# Patient Record
Sex: Female | Born: 1990 | Race: White | Hispanic: No | Marital: Single | Smoking: Former smoker
Health system: Southern US, Community
[De-identification: ages and names within clinical notes are randomized; demographics above are authoritative.]

## PROBLEM LIST (undated history)

## (undated) DIAGNOSIS — D649 Anemia, unspecified: Secondary | ICD-10-CM

## (undated) DIAGNOSIS — I10 Essential (primary) hypertension: Secondary | ICD-10-CM

## (undated) HISTORY — DX: Anemia, unspecified: D64.9

## (undated) HISTORY — DX: Essential (primary) hypertension: I10

## (undated) HISTORY — PX: OTHER SURGICAL HISTORY: SHX169

---

## 2019-10-30 DIAGNOSIS — F411 Generalized anxiety disorder: Secondary | ICD-10-CM | POA: Insufficient documentation

## 2019-11-21 NOTE — L&D Delivery Note (Addendum)
OB/GYN Faculty Practice Delivery Note  Hannah Campos is a 29 y.o. G1P0 s/p VD at [redacted]w[redacted]d at  She was admitted for IOL for preeclampsia with severe features.     AROM: 27h 65m with clear fluid GBS Status: being treated for GBS, status was unknown  Maximum Maternal Temperature: 97.6  Labor Progress: S/p Foley bulb, cytotec pitocin, and AROM   Delivery Date/Time: 8.22.21 1550 Delivery: Called to room and patient was complete and pushing. Head delivered LOA. No nuchal cord present. Shoulder and body delivered in usual fashion. Infant with spontaneous cry, placed on mother's abdomen, dried and stimulated. Cord clamped x 2 after 1-minute delay, and cut by FOB Cord blood drawn. Placenta delivered spontaneously with gentle cord traction. Fundus firm with massage and Pitocin. TXA 1 g administered to prevent additional bleeding. Cytotec 800 mcg buccally also given. Labia, perineum, vagina, and cervix inspected inspected with 1st degree vaginal.   Placenta: Hannah Campos, 3V cord Complications: no intrapartum  Lacerations: 1st degree vaginal  EBL: 587 Analgesia: epidural   SW consult ordered for Hx GAD. Continue Lexapro.    Infant: boy  APGARs 7, 8     Hannah Campos, [name], [name] 07/11/2020 5:46 PM

## 2019-12-30 DIAGNOSIS — Z3490 Encounter for supervision of normal pregnancy, unspecified, unspecified trimester: Secondary | ICD-10-CM | POA: Insufficient documentation

## 2019-12-30 DIAGNOSIS — Z87730 Personal history of (corrected) cleft lip and palate: Secondary | ICD-10-CM | POA: Insufficient documentation

## 2019-12-30 LAB — OB RESULTS CONSOLE RUBELLA ANTIBODY, IGM: Rubella: IMMUNE

## 2019-12-30 LAB — OB RESULTS CONSOLE HEPATITIS B SURFACE ANTIGEN: Hepatitis B Surface Ag: NEGATIVE

## 2019-12-30 LAB — HEPATITIS C ANTIBODY: HCV Ab: NEGATIVE

## 2019-12-31 DIAGNOSIS — Z6791 Unspecified blood type, Rh negative: Secondary | ICD-10-CM | POA: Insufficient documentation

## 2020-05-14 LAB — OB RESULTS CONSOLE HIV ANTIBODY (ROUTINE TESTING): HIV: NONREACTIVE

## 2020-05-27 ENCOUNTER — Ambulatory Visit (INDEPENDENT_AMBULATORY_CARE_PROVIDER_SITE_OTHER): Payer: Medicaid Other | Admitting: Family Medicine

## 2020-05-27 ENCOUNTER — Encounter: Payer: Self-pay | Admitting: Family Medicine

## 2020-05-27 ENCOUNTER — Other Ambulatory Visit: Payer: Self-pay

## 2020-05-27 VITALS — BP 133/84 | HR 90 | Ht 62.5 in | Wt 205.0 lb

## 2020-05-27 DIAGNOSIS — O360130 Maternal care for anti-D [Rh] antibodies, third trimester, not applicable or unspecified: Secondary | ICD-10-CM

## 2020-05-27 DIAGNOSIS — O99343 Other mental disorders complicating pregnancy, third trimester: Secondary | ICD-10-CM | POA: Diagnosis not present

## 2020-05-27 DIAGNOSIS — Z23 Encounter for immunization: Secondary | ICD-10-CM | POA: Diagnosis not present

## 2020-05-27 DIAGNOSIS — O99013 Anemia complicating pregnancy, third trimester: Secondary | ICD-10-CM | POA: Diagnosis not present

## 2020-05-27 DIAGNOSIS — D508 Other iron deficiency anemias: Secondary | ICD-10-CM

## 2020-05-27 DIAGNOSIS — Z6791 Unspecified blood type, Rh negative: Secondary | ICD-10-CM

## 2020-05-27 DIAGNOSIS — Z3400 Encounter for supervision of normal first pregnancy, unspecified trimester: Secondary | ICD-10-CM

## 2020-05-27 DIAGNOSIS — Z87730 Personal history of (corrected) cleft lip and palate: Secondary | ICD-10-CM

## 2020-05-27 DIAGNOSIS — F411 Generalized anxiety disorder: Secondary | ICD-10-CM

## 2020-05-27 DIAGNOSIS — D649 Anemia, unspecified: Secondary | ICD-10-CM

## 2020-05-27 DIAGNOSIS — Z3A29 29 weeks gestation of pregnancy: Secondary | ICD-10-CM

## 2020-05-27 NOTE — Progress Notes (Signed)
  Subjective:  Hannah Campos is a G1P0 [redacted]w[redacted]d being seen today for her first obstetrical visit. She is transferring from [facility]. She had Rhogam at 28 weeks. Failed 1hr GTT, but passed 3hr GTT. Pregnancy complicated by anxiety on Lexapro. She also has a personal history of soft palate cleft. Her grandfather had a full cleft lip. Patient does intend to breast feed. Pregnancy history fully reviewed.  Patient reports no complaints.  BP 133/84   Pulse 90   Ht 5' 2.5" (1.588 m)   Wt 205 lb (93 kg)   LMP 11/01/2019 (Exact Date)   BMI 36.90 kg/m   HISTORY: OB History  Gravida Para Term Preterm AB Living  1            SAB TAB Ectopic Multiple Live Births               # Outcome Date GA Lbr Len/2nd Weight Sex Delivery Anes PTL Lv  1 Current             No past medical history on file.  Past Surgical History:  Procedure Laterality Date  . soft cleft palate repair      Family History  Problem Relation Age of Onset  . Hypertension Father      Exam  BP 133/84   Pulse 90   Ht 5' 2.5" (1.588 m)   Wt 205 lb (93 kg)   LMP 11/01/2019 (Exact Date)   BMI 36.90 kg/m   Chaperone present during exam  CONSTITUTIONAL: Well-developed, well-nourished female in no acute distress.  HENT:  Normocephalic, atraumatic, External right and left ear normal. Oropharynx is clear and moist EYES: Conjunctivae and EOM are normal. Pupils are equal, round, and reactive to light. No scleral icterus.  NECK: Normal range of motion, supple, no masses.  Normal thyroid.  CARDIOVASCULAR: Normal heart rate noted, regular rhythm RESPIRATORY: Clear to auscultation bilaterally. Effort and breath sounds normal, no problems with respiration noted. ABDOMEN: Soft, normal bowel sounds, no distention noted.  No tenderness, rebound or guarding.  MUSCULOSKELETAL: Normal range of motion. No tenderness.  No cyanosis, clubbing, or edema.  2+ distal pulses. SKIN: Skin is warm and dry. No rash noted. Not diaphoretic.  No erythema. No pallor. NEUROLOGIC: Alert and oriented to person, place, and time. Normal reflexes, muscle tone coordination. No cranial nerve deficit noted. PSYCHIATRIC: Normal mood and affect. Normal behavior. Normal judgment and thought content.    Assessment:    Pregnancy: G1P0 Patient Active Problem List   Diagnosis Date Noted  . Supervision of normal first pregnancy, antepartum 05/27/2020      Plan:   1. Supervision of normal first pregnancy, antepartum FHT and FH normal - CHL AMB BABYSCRIPTS OPT IN  2. History of repair of congenital cleft palate [place] neg  3. GAD (generalized anxiety disorder) On lexapro.  4. Rh negative state in antepartum period Rhogam previously given  5. Anemia Start oral iron     Problem list reviewed and updated. 75% of 30 min visit spent on counseling and coordination of care.     Levie Heritage 05/27/2020

## 2020-06-15 ENCOUNTER — Ambulatory Visit (INDEPENDENT_AMBULATORY_CARE_PROVIDER_SITE_OTHER): Payer: Medicaid Other | Admitting: Advanced Practice Midwife

## 2020-06-15 ENCOUNTER — Encounter: Payer: Self-pay | Admitting: Advanced Practice Midwife

## 2020-06-15 ENCOUNTER — Other Ambulatory Visit: Payer: Self-pay

## 2020-06-15 VITALS — BP 138/84 | HR 87 | Wt 204.0 lb

## 2020-06-15 DIAGNOSIS — O99013 Anemia complicating pregnancy, third trimester: Secondary | ICD-10-CM

## 2020-06-15 DIAGNOSIS — Z3403 Encounter for supervision of normal first pregnancy, third trimester: Secondary | ICD-10-CM

## 2020-06-15 DIAGNOSIS — O99019 Anemia complicating pregnancy, unspecified trimester: Secondary | ICD-10-CM | POA: Insufficient documentation

## 2020-06-15 DIAGNOSIS — Z3A32 32 weeks gestation of pregnancy: Secondary | ICD-10-CM

## 2020-06-15 DIAGNOSIS — Z3400 Encounter for supervision of normal first pregnancy, unspecified trimester: Secondary | ICD-10-CM

## 2020-06-15 NOTE — Progress Notes (Signed)
   PRENATAL VISIT NOTE  Subjective:  Hannah Campos is a 29 y.o. G1P0 at [redacted]w[redacted]d being seen today for ongoing prenatal care.  She is currently monitored for the following issues for this low-risk pregnancy and has Pregnancy; GAD (generalized anxiety disorder); Rh negative state in antepartum period; History of repair of congenital cleft palate; and Anemia during pregnancy on their problem list.  Patient reports no complaints.  Contractions: Not present. Vag. Bleeding: None.  Movement: Present. Denies leaking of fluid.   The following portions of the patient's history were reviewed and updated as appropriate: allergies, current medications, past family history, past medical history, past social history, past surgical history and problem list.   Objective:   Vitals:   06/15/20 0909  BP: (!) 138/84  Pulse: 87  Weight: (!) 204 lb (92.5 kg)    Fetal Status: Fetal Heart Rate (bpm): 136   Movement: Present     General:  Alert, oriented and cooperative. Patient is in no acute distress.  Skin: Skin is warm and dry. No rash noted.   Cardiovascular: Normal heart rate noted  Respiratory: Normal respiratory effort, no problems with respiration noted  Abdomen: Soft, gravid, appropriate for gestational age.  Pain/Pressure: Present     Pelvic: Cervical exam deferred        Extremities: Normal range of motion.  Edema: None  Mental Status: Normal mood and affect. Normal behavior. Normal judgment and thought content.   Assessment and Plan:  Pregnancy: G1P0 at [redacted]w[redacted]d 1. Anemia during pregnancy     Taking iron, states makes her feel better  2. Supervision of normal first pregnancy, antepartum     BPs have been 130s/80s  Watch closely     Elevated 1hr GTT but passed 3 hr GTT  Preterm labor symptoms and general obstetric precautions including but not limited to vaginal bleeding, contractions, leaking of fluid and fetal movement were reviewed in detail with the patient. Please refer to After Visit  Summary for other counseling recommendations.   Return in about 2 weeks (around 06/29/2020) for [facility].  Future Appointments  Date Time Provider Department Center  07/01/2020  8:45 AM Levie Heritage, DO [facility] None    Wynelle Bourgeois, CNM

## 2020-06-15 NOTE — Patient Instructions (Signed)

## 2020-07-01 ENCOUNTER — Ambulatory Visit (INDEPENDENT_AMBULATORY_CARE_PROVIDER_SITE_OTHER): Payer: Medicaid Other | Admitting: Family Medicine

## 2020-07-01 ENCOUNTER — Other Ambulatory Visit: Payer: Self-pay

## 2020-07-01 VITALS — BP 135/92 | HR 96 | Wt 211.0 lb

## 2020-07-01 DIAGNOSIS — Z3403 Encounter for supervision of normal first pregnancy, third trimester: Secondary | ICD-10-CM

## 2020-07-01 DIAGNOSIS — O99019 Anemia complicating pregnancy, unspecified trimester: Secondary | ICD-10-CM

## 2020-07-01 DIAGNOSIS — F411 Generalized anxiety disorder: Secondary | ICD-10-CM

## 2020-07-01 DIAGNOSIS — Z6791 Unspecified blood type, Rh negative: Secondary | ICD-10-CM

## 2020-07-01 DIAGNOSIS — Z3400 Encounter for supervision of normal first pregnancy, unspecified trimester: Secondary | ICD-10-CM

## 2020-07-01 DIAGNOSIS — Z87730 Personal history of (corrected) cleft lip and palate: Secondary | ICD-10-CM

## 2020-07-01 DIAGNOSIS — O163 Unspecified maternal hypertension, third trimester: Secondary | ICD-10-CM

## 2020-07-01 DIAGNOSIS — O26893 Other specified pregnancy related conditions, third trimester: Secondary | ICD-10-CM

## 2020-07-01 DIAGNOSIS — O99013 Anemia complicating pregnancy, third trimester: Secondary | ICD-10-CM

## 2020-07-01 DIAGNOSIS — Z3A34 34 weeks gestation of pregnancy: Secondary | ICD-10-CM

## 2020-07-01 LAB — POCT URINALYSIS DIPSTICK OB: Glucose, UA: NEGATIVE

## 2020-07-01 NOTE — Progress Notes (Signed)
   PRENATAL VISIT NOTE  Subjective:  Hannah Campos is a 29 y.o. G1P0 at 41w5dbeing seen today for ongoing prenatal care.  She is currently monitored for the following issues for this high-risk pregnancy and has Pregnancy; GAD (generalized anxiety disorder); Rh negative state in antepartum period; History of repair of congenital cleft palate; and Anemia during pregnancy on their problem list.  Patient reports no complaints.  Contractions: Not present. Vag. Bleeding: None.  Movement: Present. Denies leaking of fluid.   The following portions of the patient's history were reviewed and updated as appropriate: allergies, current medications, past family history, past medical history, past social history, past surgical history and problem list.   Objective:   Vitals:   07/01/20 0847 07/01/20 0857  BP: (!) 155/98 (!) 135/92  Pulse: 90 96  Weight: 211 lb (95.7 kg)     Fetal Status: Fetal Heart Rate (bpm): 125   Movement: Present     General:  Alert, oriented and cooperative. Patient is in no acute distress.  Skin: Skin is warm and dry. No rash noted.   Cardiovascular: Normal heart rate noted  Respiratory: Normal respiratory effort, no problems with respiration noted  Abdomen: Soft, gravid, appropriate for gestational age.  Pain/Pressure: Absent     Pelvic: Cervical exam deferred        Extremities: Normal range of motion.  Edema: None  Mental Status: Normal mood and affect. Normal behavior. Normal judgment and thought content.   Assessment and Plan:  Pregnancy: G1P0 at 352w5d. Supervision of normal first pregnancy, antepartum FHT and FH normal - POC Urinalysis Dipstick OB - [place] OB DETAIL +14 WK; Future  2. [redacted] weeks gestation of pregnancy  3. Elevated blood pressure affecting pregnancy in third trimester, antepartum MIld elevation. Check next week. Will get blood work. Discussed severe symptoms.  - CBC - Comp Met (CMET) - Protein / creatinine ratio, urine - [place] OB DETAIL  +14 WK; Future  4. Anemia during pregnancy  5. History of repair of congenital cleft palate   6. Rh negative state in antepartum period Rhogam given - [place] OB DETAIL +14 WK; Future  7. GAD (generalized anxiety disorder)  Preterm labor symptoms and general obstetric precautions including but not limited to vaginal bleeding, contractions, leaking of fluid and fetal movement were reviewed in detail with the patient. Please refer to After Visit Summary for other counseling recommendations.   No follow-ups on file.  Future Appointments  Date Time Provider [place]8/19/2021 10:15 AM StTruett MainlandDO [facility] None  07/08/2020  1:30 PM [facility]  1:45 PM [facility] US5 [facility] WMDarganDO

## 2020-07-02 LAB — COMPREHENSIVE METABOLIC PANEL
ALT: 16 IU/L (ref 0–32)
AST: 25 IU/L (ref 0–40)
Albumin/Globulin Ratio: 1.2 (ref 1.2–2.2)
Albumin: 3.2 g/dL — ABNORMAL LOW (ref 3.9–5.0)
Alkaline Phosphatase: 145 IU/L — ABNORMAL HIGH (ref 48–121)
BUN/Creatinine Ratio: 3 — ABNORMAL LOW (ref 9–23)
BUN: 2 mg/dL — ABNORMAL LOW (ref 6–20)
Bilirubin Total: 0.3 mg/dL (ref 0.0–1.2)
CO2: 18 mmol/L — ABNORMAL LOW (ref 20–29)
Calcium: 9.3 mg/dL (ref 8.7–10.2)
Chloride: 101 mmol/L (ref 96–106)
Creatinine, Ser: 0.64 mg/dL (ref 0.57–1.00)
GFR calc Af Amer: 138 mL/min/{1.73_m2} (ref >59)
GFR calc non Af Amer: 120 mL/min/{1.73_m2} (ref >59)
Globulin, Total: 2.6 g/dL (ref 1.5–4.5)
Glucose: 131 mg/dL — ABNORMAL HIGH (ref 65–99)
Potassium: 3.5 mmol/L (ref 3.5–5.2)
Sodium: 136 mmol/L (ref 134–144)
Total Protein: 5.8 g/dL — ABNORMAL LOW (ref 6.0–8.5)

## 2020-07-02 LAB — PROTEIN / CREATININE RATIO, URINE
Creatinine, Urine: 35.8 mg/dL
Protein, Ur: 13.1 mg/dL
Protein/Creat Ratio: 366 mg/g creat — ABNORMAL HIGH (ref 0–200)

## 2020-07-02 LAB — CBC
Hematocrit: 32 % — ABNORMAL LOW (ref 34.0–46.6)
Hemoglobin: 10.4 g/dL — ABNORMAL LOW (ref 11.1–15.9)
MCH: 26.9 pg (ref 26.6–33.0)
MCHC: 32.5 g/dL (ref 31.5–35.7)
MCV: 83 fL (ref 79–97)
Platelets: 257 10*3/uL (ref 150–450)
RBC: 3.86 x10E6/uL (ref 3.77–5.28)
RDW: 17.5 % — ABNORMAL HIGH (ref 11.7–15.4)
WBC: 9.8 10*3/uL (ref 3.4–10.8)

## 2020-07-08 ENCOUNTER — Telehealth: Payer: Self-pay

## 2020-07-08 ENCOUNTER — Inpatient Hospital Stay (HOSPITAL_COMMUNITY)
Admission: AD | Admit: 2020-07-08 | Discharge: 2020-07-14 | DRG: 806 | Disposition: A | Discharge status: Home / Self Care | Payer: BLUE CROSS/BLUE SHIELD | Attending: Obstetrics and Gynecology | Admitting: Obstetrics and Gynecology

## 2020-07-08 ENCOUNTER — Ambulatory Visit: Payer: BLUE CROSS/BLUE SHIELD | Admitting: *Deleted

## 2020-07-08 ENCOUNTER — Ambulatory Visit (HOSPITAL_BASED_OUTPATIENT_CLINIC_OR_DEPARTMENT_OTHER): Payer: BLUE CROSS/BLUE SHIELD

## 2020-07-08 ENCOUNTER — Other Ambulatory Visit (HOSPITAL_COMMUNITY)
Admission: RE | Admit: 2020-07-08 | Discharge: 2020-07-08 | Disposition: A | Discharge status: Home / Self Care | Payer: BLUE CROSS/BLUE SHIELD | Source: Ambulatory Visit | Attending: Family Medicine | Admitting: Family Medicine

## 2020-07-08 ENCOUNTER — Other Ambulatory Visit: Payer: Self-pay

## 2020-07-08 ENCOUNTER — Ambulatory Visit (INDEPENDENT_AMBULATORY_CARE_PROVIDER_SITE_OTHER): Payer: BLUE CROSS/BLUE SHIELD | Admitting: Family Medicine

## 2020-07-08 ENCOUNTER — Encounter (HOSPITAL_COMMUNITY): Payer: Self-pay | Admitting: Obstetrics and Gynecology

## 2020-07-08 ENCOUNTER — Ambulatory Visit: Payer: BLUE CROSS/BLUE SHIELD

## 2020-07-08 VITALS — BP 142/99 | HR 91 | Wt 209.0 lb

## 2020-07-08 VITALS — BP 145/110 | HR 125

## 2020-07-08 DIAGNOSIS — O99213 Obesity complicating pregnancy, third trimester: Secondary | ICD-10-CM | POA: Diagnosis not present

## 2020-07-08 DIAGNOSIS — O26899 Other specified pregnancy related conditions, unspecified trimester: Secondary | ICD-10-CM | POA: Insufficient documentation

## 2020-07-08 DIAGNOSIS — O99013 Anemia complicating pregnancy, third trimester: Secondary | ICD-10-CM

## 2020-07-08 DIAGNOSIS — Z3480 Encounter for supervision of other normal pregnancy, unspecified trimester: Secondary | ICD-10-CM

## 2020-07-08 DIAGNOSIS — Z3A35 35 weeks gestation of pregnancy: Secondary | ICD-10-CM

## 2020-07-08 DIAGNOSIS — Z87891 Personal history of nicotine dependence: Secondary | ICD-10-CM

## 2020-07-08 DIAGNOSIS — Z3483 Encounter for supervision of other normal pregnancy, third trimester: Secondary | ICD-10-CM | POA: Diagnosis not present

## 2020-07-08 DIAGNOSIS — O99019 Anemia complicating pregnancy, unspecified trimester: Secondary | ICD-10-CM

## 2020-07-08 DIAGNOSIS — O99343 Other mental disorders complicating pregnancy, third trimester: Secondary | ICD-10-CM

## 2020-07-08 DIAGNOSIS — O1493 Unspecified pre-eclampsia, third trimester: Secondary | ICD-10-CM | POA: Diagnosis present

## 2020-07-08 DIAGNOSIS — O99344 Other mental disorders complicating childbirth: Secondary | ICD-10-CM | POA: Diagnosis present

## 2020-07-08 DIAGNOSIS — Z6791 Unspecified blood type, Rh negative: Secondary | ICD-10-CM

## 2020-07-08 DIAGNOSIS — O1414 Severe pre-eclampsia complicating childbirth: Secondary | ICD-10-CM | POA: Diagnosis not present

## 2020-07-08 DIAGNOSIS — O163 Unspecified maternal hypertension, third trimester: Secondary | ICD-10-CM

## 2020-07-08 DIAGNOSIS — O9081 Anemia of the puerperium: Secondary | ICD-10-CM | POA: Diagnosis not present

## 2020-07-08 DIAGNOSIS — F419 Anxiety disorder, unspecified: Secondary | ICD-10-CM

## 2020-07-08 DIAGNOSIS — D62 Acute posthemorrhagic anemia: Secondary | ICD-10-CM | POA: Diagnosis not present

## 2020-07-08 DIAGNOSIS — O26891 Other specified pregnancy related conditions, first trimester: Secondary | ICD-10-CM

## 2020-07-08 DIAGNOSIS — F411 Generalized anxiety disorder: Secondary | ICD-10-CM | POA: Diagnosis not present

## 2020-07-08 DIAGNOSIS — O26893 Other specified pregnancy related conditions, third trimester: Secondary | ICD-10-CM

## 2020-07-08 DIAGNOSIS — F410 Panic disorder [episodic paroxysmal anxiety] without agoraphobia: Secondary | ICD-10-CM | POA: Diagnosis present

## 2020-07-08 DIAGNOSIS — E669 Obesity, unspecified: Secondary | ICD-10-CM

## 2020-07-08 DIAGNOSIS — Z363 Encounter for antenatal screening for malformations: Secondary | ICD-10-CM

## 2020-07-08 DIAGNOSIS — Z3400 Encounter for supervision of normal first pregnancy, unspecified trimester: Secondary | ICD-10-CM

## 2020-07-08 DIAGNOSIS — Z20822 Contact with and (suspected) exposure to covid-19: Secondary | ICD-10-CM | POA: Diagnosis present

## 2020-07-08 DIAGNOSIS — R030 Elevated blood-pressure reading, without diagnosis of hypertension: Secondary | ICD-10-CM | POA: Diagnosis not present

## 2020-07-08 DIAGNOSIS — Z3A36 36 weeks gestation of pregnancy: Secondary | ICD-10-CM

## 2020-07-08 DIAGNOSIS — O119 Pre-existing hypertension with pre-eclampsia, unspecified trimester: Secondary | ICD-10-CM

## 2020-07-08 DIAGNOSIS — O1490 Unspecified pre-eclampsia, unspecified trimester: Secondary | ICD-10-CM | POA: Diagnosis present

## 2020-07-08 LAB — CBC
HCT: 35.8 % — ABNORMAL LOW (ref 36.0–46.0)
Hemoglobin: 11.1 g/dL — ABNORMAL LOW (ref 12.0–15.0)
MCH: 27.4 pg (ref 26.0–34.0)
MCHC: 31 g/dL (ref 30.0–36.0)
MCV: 88.4 fL (ref 80.0–100.0)
Platelets: 277 10*3/uL (ref 150–400)
RBC: 4.05 MIL/uL (ref 3.87–5.11)
RDW: 20.2 % — ABNORMAL HIGH (ref 11.5–15.5)
WBC: 11.3 10*3/uL — ABNORMAL HIGH (ref 4.0–10.5)
nRBC: 0 % (ref 0.0–0.2)

## 2020-07-08 LAB — URINALYSIS, ROUTINE W REFLEX MICROSCOPIC
Bilirubin Urine: NEGATIVE
Glucose, UA: NEGATIVE mg/dL
Hgb urine dipstick: NEGATIVE
Ketones, ur: 80 mg/dL — AB
Leukocytes,Ua: NEGATIVE
Nitrite: NEGATIVE
Protein, ur: NEGATIVE mg/dL
Specific Gravity, Urine: 1.015 (ref 1.005–1.030)
pH: 7 (ref 5.0–8.0)

## 2020-07-08 LAB — POCT URINALYSIS DIPSTICK OB
Blood, UA: NEGATIVE
Glucose, UA: NEGATIVE
Spec Grav, UA: 1.01 (ref 1.010–1.025)
pH, UA: 6 (ref 5.0–8.0)

## 2020-07-08 LAB — PROTEIN / CREATININE RATIO, URINE
Creatinine, Urine: 112.63 mg/dL
Protein Creatinine Ratio: 0.14 mg/mg{Cre} (ref 0.00–0.15)
Total Protein, Urine: 16 mg/dL

## 2020-07-08 MED ORDER — BETAMETHASONE SOD PHOS & ACET 6 (3-3) MG/ML IJ SUSP
12.0000 mg | Freq: Once | INTRAMUSCULAR | Status: AC
  Administered 2020-07-08: 12 mg via INTRAMUSCULAR

## 2020-07-08 NOTE — Progress Notes (Signed)
Patient states she is having some anxiety. Armandina Stammer RN

## 2020-07-08 NOTE — MAU Provider Note (Addendum)
Chief Complaint:  Hypertension   First Provider Initiated Contact with Patient 07/08/20 2227     HPI: Hannah Campos is a 29 y.o. G1P0 at 35w5dwho presents to maternity admissions reporting elevated blood pressures at home.  Has been followed at [facility] for gradually increasing GHTN since  07/01/20 by Dr Stinson.   Protein/Creat was 366 on 07/01/20, so diagnosis was changed to preeclampsia and IOL was scheduled for 37 weeks.  She was seen at MFM today for BPP/growth and BPs were noted to be more elevated.  THey gave her a first dose of Betamethasone and recommended she check her BP frequently at home.  She became worried and presented here.  . She reports good fetal movement, denies LOF, vaginal bleeding, vaginal itching/burning, urinary symptoms, h/a, dizziness, n/v, diarrhea, constipation or fever/chills.  She denies headache, visual changes or RUQ abdominal pain.  Hypertension This is a recurrent problem. The current episode started 1 to 4 weeks ago. The problem has been gradually worsening since onset. Associated symptoms include anxiety and peripheral edema. Pertinent negatives include no blurred vision, chest pain, headaches, malaise/fatigue or shortness of breath. There are no associated agents to hypertension. There are no known risk factors for coronary artery disease. Past treatments include nothing. There are no compliance problems.     RN Note: I have preeclampsia. I had appts at my ob today and MFM and my b/p was high at both visits. Had BMZ today. Alittle mild pressue in pelvis but no other pain. Cervix was 0.5cm today and 80% effaced. No headaches, epigastric pain or any other concerns  Past Medical History: Past Medical History:  Diagnosis Date  . Anemia   . Hypertension     Past obstetric history: OB History  Gravida Para Term Preterm AB Living  1         0  SAB TAB Ectopic Multiple Live Births               # Outcome Date GA Lbr Len/2nd Weight Sex Delivery Anes PTL Lv   1 Current             Past Surgical History: Past Surgical History:  Procedure Laterality Date  . soft cleft palate repair      Family History: Family History  Problem Relation Age of Onset  . Hypertension Father     Social History: Social History   Tobacco Use  . Smoking status: Former Smoker    Years: 1.00    Types: E-cigarettes    Quit date: 11/28/2019    Years since quitting: 0.6  . Smokeless tobacco: Former User    Quit date: 11/21/2019  . Tobacco comment: none since January  Vaping Use  . Vaping Use: Former  . Start date: 12/05/2019  Substance Use Topics  . Alcohol use: Not Currently  . Drug use: Never    Allergies:  Allergies  Allergen Reactions  . Adhesive [Tape] Other (See Comments)    Swelling where it was applied    Meds:  Medications Prior to Admission  Medication Sig Dispense Refill Last Dose  . cyclobenzaprine (FLEXERIL) 10 MG tablet Take 10 mg by mouth 3 (three) times daily as needed. (Patient not taking: Reported on 07/08/2020)     . diphenhydrAMINE (BENADRYL) 25 mg capsule Take 25 mg by mouth every 6 (six) hours as needed.     . escitalopram (LEXAPRO) 10 MG tablet 10 mg.     . LORazepam (ATIVAN) 1 MG tablet 1 mg.     .   Prenatal Vit-Fe Fumarate-FA (PRENATAL VITAMINS PO) Take by mouth.     . PRENATAL VIT-FE FUMARATE-FA PO Take by mouth.       I have reviewed patient's Past Medical Hx, Surgical Hx, Family Hx, Social Hx, medications and allergies.   ROS:  Review of Systems  Constitutional: Negative for malaise/fatigue.  Eyes: Negative for blurred vision.  Respiratory: Negative for shortness of breath.   Cardiovascular: Negative for chest pain.  Neurological: Negative for headaches.   Other systems negative  Physical Exam   Patient Vitals for the past 24 hrs:  BP Temp Pulse Resp Height Weight  07/08/20 2223 (!) 152/100 -- (!) 115 -- -- --  07/08/20 2222 (!) 152/100 -- (!) 115 -- -- --  07/08/20 2212 (!) 156/97 -- (!) 110 -- -- --   07/08/20 2211 -- -- -- -- 5' 2.5" (1.588 m) 94.8 kg  07/08/20 2204 -- 97.7 F (36.5 C) -- 18 -- --   Vitals:   07/08/20 2345 07/09/20 0000 07/09/20 0015 07/09/20 0030  BP: (!) 153/85 (!) 145/87 (!) 161/91 (!) 150/87  Pulse: 94 95 (!) 101 96  Resp:      Temp:      SpO2:  98% 98% 98%  Weight:      Height:        Constitutional: Well-developed, well-nourished female in no acute distress.  Cardiovascular: normal rate and rhythm Respiratory: normal effort, clear to auscultation bilaterally GI: Abd soft, non-tender, gravid appropriate for gestational age.   No rebound or guarding. MS: Extremities nontender, no edema, normal ROM Neurologic: Alert and oriented x 4.  GU: Neg CVAT.  PELVIC EXAM: deferred  FHT:  Baseline 130 , moderate variability, accelerations present, no decelerations Contractions: q 3 mins Irregular    Labs: Results for orders placed or performed during the hospital encounter of 07/08/20 (from the past 24 hour(s))  Urinalysis, Routine w reflex microscopic Urine, Clean Catch     Status: Abnormal   Collection Time: 07/08/20 10:11 PM  Result Value Ref Range   Color, Urine YELLOW YELLOW   APPearance CLEAR CLEAR   Specific Gravity, Urine 1.015 1.005 - 1.030   pH 7.0 5.0 - 8.0   Glucose, UA NEGATIVE NEGATIVE mg/dL   Hgb urine dipstick NEGATIVE NEGATIVE   Bilirubin Urine NEGATIVE NEGATIVE   Ketones, ur 80 (A) NEGATIVE mg/dL   Protein, ur NEGATIVE NEGATIVE mg/dL   Nitrite NEGATIVE NEGATIVE   Leukocytes,Ua NEGATIVE NEGATIVE  CBC     Status: Abnormal   Collection Time: 07/08/20 10:27 PM  Result Value Ref Range   WBC 11.3 (H) 4.0 - 10.5 K/uL   RBC 4.05 3.87 - 5.11 MIL/uL   Hemoglobin 11.1 (L) 12.0 - 15.0 g/dL   HCT 35.8 (L) 36 - 46 %   MCV 88.4 80.0 - 100.0 fL   MCH 27.4 26.0 - 34.0 pg   MCHC 31.0 30.0 - 36.0 g/dL   RDW 20.2 (H) 11.5 - 15.5 %   Platelets 277 150 - 400 K/uL   nRBC 0.0 0.0 - 0.2 %  Comprehensive metabolic panel     Status: Abnormal    Collection Time: 07/08/20 10:27 PM  Result Value Ref Range   Sodium 136 135 - 145 mmol/L   Potassium 4.1 3.5 - 5.1 mmol/L   Chloride 103 98 - 111 mmol/L   CO2 23 22 - 32 mmol/L   Glucose, Bld 131 (H) 70 - 99 mg/dL   BUN 7 6 - 20 mg/dL   Creatinine,   Ser 0.73 0.44 - 1.00 mg/dL   Calcium 9.9 8.9 - 10.3 mg/dL   Total Protein 6.8 6.5 - 8.1 g/dL   Albumin 2.9 (L) 3.5 - 5.0 g/dL   AST 61 (H) 15 - 41 U/L   ALT 30 0 - 44 U/L   Alkaline Phosphatase 147 (H) 38 - 126 U/L   Total Bilirubin 0.6 0.3 - 1.2 mg/dL   GFR calc non Af Amer >60 >60 mL/min   GFR calc Af Amer >60 >60 mL/min   Anion gap 10 5 - 15  Protein / creatinine ratio, urine     Status: None   Collection Time: 07/08/20 10:27 PM  Result Value Ref Range   Creatinine, Urine 112.63 mg/dL   Total Protein, Urine 16 mg/dL   Protein Creatinine Ratio 0.14 0.00 - 0.15 mg/mg[Cre]    Imaging:  US MFM FETAL BPP WO NON STRESS  Result Date: 07/08/2020 ----------------------------------------------------------------------  OBSTETRICS REPORT                       (Signed Final 07/08/2020 05:27 pm) ---------------------------------------------------------------------- Patient Info  ID #:       [id number]                          D.O.B.:  08/24/1991 (29 yrs)  Name:       Hannah Campos                Visit Date: 07/08/2020 01:48 pm ---------------------------------------------------------------------- Performed By  Attending:        Victor Fang MD         Secondary Phy.:   [facility]  Performed By:     Kasie E Kiser BS,      Address:          [address]                    [address]                                                             [address]  Referred By:      JACOB J STINSON        Location:         [facility]                    MD                                       [facility]  Ref. Address:      [address]                    [address]                    [place]                    [address] ---------------------------------------------------------------------- Orders  #  Description                           Code        Ordered By  1  US MFM OB DETAIL +14 WK               76811.01    JACOB STINSON  2  US MFM FETAL BPP WO NON               76819.01    JACOB STINSON     STRESS ----------------------------------------------------------------------  #  Order #                     Accession #                Episode #  1  [id number]                   [id number]                 [id number]  2  [id number]                   [id number]                 [id number] ---------------------------------------------------------------------- Indications  Pre-eclampsia, third trimester                 O14.90  [redacted] weeks gestation of pregnancy                Z3A.35  Encounter for antenatal screening for          Z36.3  malformations  Obesity complicating pregnancy, third          O99.213  trimester  Rh negative state in antepartum                O36.0190  Anemia during pregnancy in third trimester     O99.013 ---------------------------------------------------------------------- Vital Signs  Weight (lb): 209                               Height:        5'2"  BMI:         38.22 ---------------------------------------------------------------------- Fetal Evaluation  Num Of Fetuses:         1  Fetal Heart Rate(bpm):  153  Cardiac Activity:       Observed  Presentation:           Cephalic  Placenta:               Posterior Fundal  P. Cord Insertion:      Not well visualized  Amniotic Fluid  AFI FV:      Within normal limits  AFI Sum(cm)     %Tile       Largest Pocket(cm)  16.45           61            7.2  RUQ(cm)       RLQ(cm)       LUQ(cm)        LLQ(cm)  7.2           1.04          3.48           4.73 ---------------------------------------------------------------------- Biophysical Evaluation  Amniotic F.V:   Pocket => 2 cm              F. Tone:        Observed  F. Movement:    Observed                   Score:          8/8  F. Breathing:   Observed ---------------------------------------------------------------------- Biometry  BPD:      90.4  mm     G. Age:  36w 4d         80  %    CI:        78.12   %    70 - 86                                                          FL/HC:      20.6   %    20.1 - 22.1  HC:      323.6  mm     G. Age:  36w 4d         39  %    HC/AC:      0.95        0.93 - 1.11  AC:      341.8  mm     G. Age:  38w 1d         98  %    FL/BPD:     73.9   %    71 - 87  FL:       66.8  mm     G. Age:  34w 3d         14  %    FL/AC:      19.5   %    20 - 24  Est. FW:    3058  gm    6 lb 12 oz      81  % ---------------------------------------------------------------------- OB History  Gravidity:    1 ---------------------------------------------------------------------- Gestational Age  LMP:           35w 5d        Date:  11/01/19                 EDD:   08/07/20  U/S Today:     36w 3d                                        EDD:   08/02/20  Best:          35w 5d     Det. By:  LMP  (11/01/19)          EDD:   08/07/20 ---------------------------------------------------------------------- Anatomy  Cranium:               Appears normal           Aortic Arch:            Appears normal  Cavum:                 Appears normal         Ductal Arch:            Appears normal  Ventricles:            Appears normal         Diaphragm:              Appears normal  Choroid Plexus:        Appears normal         Stomach:                Appears normal, left                                                                        sided  Cerebellum:            Appears normal         Abdomen:                Appears normal  Posterior Fossa:       Not well visualized    Abdominal Wall:         Not well visualized  Nuchal Fold:           Not well visualized    Cord Vessels:           Appears normal (3                                                                         vessel cord)  Face:                  Orbits appear          Kidneys:                Appear normal                         normal  Lips:                  Appears normal         Bladder:                Appears normal  Thoracic:              Appears normal         Spine:                  Limited views                                                                          appear normal  Heart:                 Not well visualized    Upper Extremities:      Not well visualized  RVOT:                  Not well visualized    Lower Extremities:      Visualized  LVOT:                  Not well visualized  Other:  Nasal bone and Right Heel visualized. Technically difficult due to          advanced GA and fetal position. Technically difficult due to maternal          habitus. ---------------------------------------------------------------------- Cervix Uterus Adnexa  Cervix  Not visualized (advanced GA >24wks) ---------------------------------------------------------------------- Comments  This patient was seen for an ultrasound exam due to recently  diagnosed preeclampsia.  Her P/C ratio performed last week  indicated 366 mg of protein.  The patient reports that she also  has severe anxiety which may contribute to her elevated  blood pressures.  The patient's initial blood pressure in our  office was 145/110.  On repeat it was 157/97.  She denies  any signs or symptoms of severe preeclampsia.  She had  PIH labs drawn this morning.  The results of those labs are  currently pending.  She was informed that the fetal growth and amniotic fluid  level appears appropriate for her gestational age.  The views  of the fetal anatomy were limited today due to her advanced  gestational age.  A biophysical profile performed today was 8 out of 8.  Due to preeclampsia with elevated blood pressures, I will give  the patient a complete course of antenatal corticosteroids  starting today.  She will return to our office tomorrow to   receive the second dose.  Preeclampsia precautions were reviewed with the patient  today.  She was advised to monitor her blood pressures at  home and to go to the hospital should her blood pressures be  persistently greater than 150s over high 90s.  She already  has an induction scheduled in 9 days at around 37 weeks.  Delivery prior to 37 weeks would be indicated should she  complain of any signs or symptoms of preeclampsia, should  her PIH labs show any abnormalities, or should her blood  pressures continue to increase. ----------------------------------------------------------------------                   Victor Fang, MD Electronically Signed Final Report   07/08/2020 05:27 pm ----------------------------------------------------------------------  US MFM OB DETAIL +14 WK  Result Date: 07/08/2020 ----------------------------------------------------------------------  OBSTETRICS REPORT                       (Signed Final 07/08/2020 05:27 pm) ---------------------------------------------------------------------- Patient Info  ID #:       [id number]                          D.O.B.:  10/23/1991 (29 yrs)  Name:       Hannah Campos                Visit Date: 07/08/2020 01:48 pm ---------------------------------------------------------------------- Performed By  Attending:        Victor Fang MD         Secondary Phy.:   [facility]    Performed By:     Kasie E Kiser BS,      Address:          [address]                    [address]                                                             [address]  Referred By:      JACOB J STINSON        Location:         [facility]                    MD                                       [facility]  Ref. Address:     [address]                    [address]                    [place]                    [address]  ---------------------------------------------------------------------- Orders  #  Description                           Code        Ordered By  1  US MFM OB DETAIL +14 WK               76811.01    JACOB STINSON  2  US MFM FETAL BPP WO NON               76819.01    JACOB STINSON     STRESS ----------------------------------------------------------------------  #  Order #                     Accession #                Episode #  1  [id number]                   [id number]                 [id number]  2  [id number]                     [id number]                 [id number] ---------------------------------------------------------------------- Indications  Pre-eclampsia, third trimester                 O14.90  [redacted] weeks gestation of pregnancy                Z3A.35  Encounter for antenatal screening for          Z36.3  malformations  Obesity complicating pregnancy, third          O99.213  trimester  Rh negative state in antepartum                O36.0190  Anemia during pregnancy in third trimester     O99.013 ---------------------------------------------------------------------- Vital Signs  Weight (lb): 209                               Height:        5'2"  BMI:         38.22 ---------------------------------------------------------------------- Fetal Evaluation  Num Of Fetuses:         1  Fetal Heart Rate(bpm):  153  Cardiac Activity:       Observed  Presentation:           Cephalic  Placenta:               Posterior Fundal  P. Cord Insertion:      Not well visualized  Amniotic Fluid  AFI FV:      Within normal limits  AFI Sum(cm)     %Tile       Largest Pocket(cm)  16.45           61          7.2  RUQ(cm)       RLQ(cm)       LUQ(cm)        LLQ(cm)  7.2           1.04          3.48           4.73 ---------------------------------------------------------------------- Biophysical Evaluation  Amniotic F.V:   Pocket => 2 cm             F. Tone:        Observed  F. Movement:    Observed                   Score:          8/8  F.  Breathing:   Observed ---------------------------------------------------------------------- Biometry  BPD:      90.4  mm     G. Age:  36w 4d         80  %    CI:        78.12   %    70 - 86                                                          FL/HC:      20.6   %    20.1 - 22.1  HC:      323.6  mm     G. Age:  36w 4d           39  %    HC/AC:      0.95        0.93 - 1.11  AC:      341.8  mm     G. Age:  38w 1d         98  %    FL/BPD:     73.9   %    71 - 87  FL:       66.8  mm     G. Age:  34w 3d         14  %    FL/AC:      19.5   %    20 - 24  Est. FW:    3058  gm    6 lb 12 oz      81  % ---------------------------------------------------------------------- OB History  Gravidity:    1 ---------------------------------------------------------------------- Gestational Age  LMP:           35w 5d        Date:  11/01/19                 EDD:   08/07/20  U/S Today:     36w 3d                                        EDD:   08/02/20  Best:          35w 5d     Det. By:  LMP  (11/01/19)          EDD:   08/07/20 ---------------------------------------------------------------------- Anatomy  Cranium:               Appears normal         Aortic Arch:            Appears normal  Cavum:                 Appears normal         Ductal Arch:            Appears normal  Ventricles:            Appears normal         Diaphragm:              Appears normal  Choroid Plexus:        Appears normal         Stomach:                Appears normal, left                                                                        sided  Cerebellum:            Appears normal         Abdomen:                Appears normal  Posterior Fossa:       Not well visualized    Abdominal Wall:         Not well visualized  Nuchal Fold:             Not well visualized    Cord Vessels:           Appears normal (3                                                                        vessel cord)  Face:                  Orbits appear          Kidneys:                 Appear normal                         normal  Lips:                  Appears normal         Bladder:                Appears normal  Thoracic:              Appears normal         Spine:                  Limited views                                                                        appear normal  Heart:                 Not well visualized    Upper Extremities:      Not well visualized  RVOT:                  Not well visualized    Lower Extremities:      Visualized  LVOT:                  Not well visualized  Other:  Nasal bone and Right Heel visualized. Technically difficult due to          advanced GA and fetal position. Technically difficult due to maternal          habitus. ---------------------------------------------------------------------- Cervix Uterus Adnexa  Cervix  Not visualized (advanced GA >24wks) ---------------------------------------------------------------------- Comments  This patient was seen for an ultrasound exam due to recently  diagnosed preeclampsia.  Her P/C ratio performed last week  indicated 366 mg of protein.  The patient reports that she also  has severe anxiety which may contribute to her elevated  blood pressures.  The patient's initial blood pressure in our  office was 145/110.  On repeat it was 157/97.  She denies  any signs or symptoms of severe preeclampsia.  She had  PIH labs drawn this morning.  The results of those labs are  currently pending.  She was informed that the fetal growth and amniotic fluid  level appears appropriate for her gestational age.  The views  of the fetal   anatomy were limited today due to her advanced  gestational age.  A biophysical profile performed today was 8 out of 8.  Due to preeclampsia with elevated blood pressures, I will give  the patient a complete course of antenatal corticosteroids  starting today.  She will return to our office tomorrow to  receive the second dose.  Preeclampsia precautions were reviewed with the patient  today.  She  was advised to monitor her blood pressures at  home and to go to the hospital should her blood pressures be  persistently greater than 150s over high 90s.  She already  has an induction scheduled in 9 days at around 37 weeks.  Delivery prior to 37 weeks would be indicated should she  complain of any signs or symptoms of preeclampsia, should  her PIH labs show any abnormalities, or should her blood  pressures continue to increase. ----------------------------------------------------------------------                   Victor Fang, MD Electronically Signed Final Report   07/08/2020 05:27 pm ----------------------------------------------------------------------   MAU Course/MDM: I have ordered labs and reviewed results. AST is slightly elevated.  Pr/Cr is normal today NST reviewed, reassuring category I Consult Dr Bass with presentation, exam findings and test results. He recommends admission for observation. Treatments in MAU included EFM.    Assessment: Single IUP at [redacted]w[redacted]d Preeclampsia, borderline severe range BPs  Plan: Admit to [facility] Routine orders Second dose of Betamethasone tomorrow MD to follow   Lauraine Crespo CNM, MSN Certified Nurse-Midwife 07/08/2020 10:27 PM 

## 2020-07-08 NOTE — MAU Note (Signed)
I have preeclampsia. I had appts at my ob today and MFM and my b/p was high at both visits. Had BMZ today. Alittle mild pressue in pelvis but no other pain. Cervix was 0.5cm today and 80% effaced. No headaches, epigastric pain or any other concerns

## 2020-07-08 NOTE — Progress Notes (Signed)
BMZ #1 given in MFM per Dr. Parke Poisson.

## 2020-07-08 NOTE — Telephone Encounter (Signed)
Pt called stating she was given BMZ because of her elevated BP during her BPP appt. Pt concerned about  Elevated BP and induction date. Will send a message to provider.  Deniese Oberry l Clova Morlock, CMA

## 2020-07-08 NOTE — Progress Notes (Signed)
   PRENATAL VISIT NOTE  Subjective:  Hannah Campos is a 29 y.o. G1P0 at [redacted]w[redacted]d being seen today for ongoing prenatal care.  She is currently monitored for the following issues for this high-risk pregnancy and has Pregnancy; GAD (generalized anxiety disorder); Rh negative state in antepartum period; History of repair of congenital cleft palate; Anemia during pregnancy; and Pre-eclampsia in third trimester on their problem list.  Patient reports no complaints.  Contractions: Not present. Vag. Bleeding: None.  Movement: Present. Denies leaking of fluid.   The following portions of the patient's history were reviewed and updated as appropriate: allergies, current medications, past family history, past medical history, past social history, past surgical history and problem list.   Objective:   Vitals:   07/08/20 1004  BP: (!) 142/99  Pulse: 91  Weight: 209 lb (94.8 kg)    Fetal Status: Fetal Heart Rate (bpm): 140 Fundal Height: 36 cm Movement: Present  Presentation: Vertex  General:  Alert, oriented and cooperative. Patient is in no acute distress.  Skin: Skin is warm and dry. No rash noted.   Cardiovascular: Normal heart rate noted  Respiratory: Normal respiratory effort, no problems with respiration noted  Abdomen: Soft, gravid, appropriate for gestational age.  Pain/Pressure: Present     Pelvic: Cervical exam performed in the presence of a chaperone Dilation: 1.5 Effacement (%): 70 Station: -3  Extremities: Normal range of motion.  Edema: Trace  Mental Status: Normal mood and affect. Normal behavior. Normal judgment and thought content.   Assessment and Plan:  Pregnancy: G1P0 at [redacted]w[redacted]d 1. [redacted] weeks gestation of pregnancy  - Culture, beta strep (group b only) - GC/Chlamydia probe amp ([place])not at [facility] - POC Urinalysis Dipstick OB - CBC - Comprehensive metabolic panel  2. Supervision of other normal pregnancy, antepartum FHT and FH normal - Culture, beta strep (group b  only) - GC/Chlamydia probe amp ([place])not at [facility] - POC Urinalysis Dipstick OB - US MFM FETAL BPP WO NON STRESS; Future - CBC - Comprehensive metabolic panel  3. GAD (generalized anxiety disorder) On lexapro with ativan PRN  4. Anemia during pregnancy  5. Rh negative state in antepartum period Rhogam given at 28 weeks  6. Pre-eclampsia in third trimester Induce at 37 weeks. [place] today with BPP - [place] MFM FETAL BPP WO NON STRESS; Future  Preterm labor symptoms and general obstetric precautions including but not limited to vaginal bleeding, contractions, leaking of fluid and fetal movement were reviewed in detail with the patient. Please refer to After Visit Summary for other counseling recommendations.   Return in about 1 week (around 07/15/2020).  Future Appointments  Date Time Provider Department Center  07/08/2020  1:30 PM [facility] NURSE [facility]  07/08/2020  1:45 PM [facility] US5 [facility] [place]  07/16/2020  9:15 AM Levie Heritage, DO [facility] None  07/17/2020  9:25 AM [facility] SCHED ROOM [facility] None    Levie Heritage, DO

## 2020-07-09 ENCOUNTER — Other Ambulatory Visit: Payer: Medicaid Other

## 2020-07-09 ENCOUNTER — Ambulatory Visit: Payer: Medicaid Other

## 2020-07-09 ENCOUNTER — Encounter (HOSPITAL_COMMUNITY): Payer: Self-pay | Admitting: Obstetrics and Gynecology

## 2020-07-09 ENCOUNTER — Other Ambulatory Visit: Payer: Self-pay | Admitting: *Deleted

## 2020-07-09 DIAGNOSIS — Z20822 Contact with and (suspected) exposure to covid-19: Secondary | ICD-10-CM | POA: Diagnosis present

## 2020-07-09 DIAGNOSIS — Z3A36 36 weeks gestation of pregnancy: Secondary | ICD-10-CM | POA: Diagnosis not present

## 2020-07-09 DIAGNOSIS — O1414 Severe pre-eclampsia complicating childbirth: Secondary | ICD-10-CM | POA: Diagnosis present

## 2020-07-09 DIAGNOSIS — Z3A37 37 weeks gestation of pregnancy: Secondary | ICD-10-CM | POA: Diagnosis not present

## 2020-07-09 DIAGNOSIS — O9081 Anemia of the puerperium: Secondary | ICD-10-CM | POA: Diagnosis not present

## 2020-07-09 DIAGNOSIS — O1493 Unspecified pre-eclampsia, third trimester: Secondary | ICD-10-CM | POA: Diagnosis not present

## 2020-07-09 DIAGNOSIS — O99210 Obesity complicating pregnancy, unspecified trimester: Secondary | ICD-10-CM

## 2020-07-09 DIAGNOSIS — O1490 Unspecified pre-eclampsia, unspecified trimester: Secondary | ICD-10-CM | POA: Diagnosis present

## 2020-07-09 DIAGNOSIS — Z87891 Personal history of nicotine dependence: Secondary | ICD-10-CM | POA: Diagnosis not present

## 2020-07-09 DIAGNOSIS — O99344 Other mental disorders complicating childbirth: Secondary | ICD-10-CM | POA: Diagnosis present

## 2020-07-09 DIAGNOSIS — R030 Elevated blood-pressure reading, without diagnosis of hypertension: Secondary | ICD-10-CM | POA: Diagnosis present

## 2020-07-09 DIAGNOSIS — Z3A35 35 weeks gestation of pregnancy: Secondary | ICD-10-CM | POA: Diagnosis not present

## 2020-07-09 DIAGNOSIS — D62 Acute posthemorrhagic anemia: Secondary | ICD-10-CM | POA: Diagnosis not present

## 2020-07-09 DIAGNOSIS — O1413 Severe pre-eclampsia, third trimester: Secondary | ICD-10-CM | POA: Diagnosis not present

## 2020-07-09 DIAGNOSIS — F410 Panic disorder [episodic paroxysmal anxiety] without agoraphobia: Secondary | ICD-10-CM | POA: Diagnosis present

## 2020-07-09 LAB — COMPREHENSIVE METABOLIC PANEL
ALT: 27 IU/L (ref 0–32)
ALT: 30 U/L (ref 0–44)
ALT: 23 U/L (ref 0–44)
AST: 41 IU/L — ABNORMAL HIGH (ref 0–40)
AST: 61 U/L — ABNORMAL HIGH (ref 15–41)
AST: 29 U/L (ref 15–41)
Albumin/Globulin Ratio: 1.4 (ref 1.2–2.2)
Albumin: 3.7 g/dL — ABNORMAL LOW (ref 3.9–5.0)
Albumin: 2.9 g/dL — ABNORMAL LOW (ref 3.5–5.0)
Albumin: 2.6 g/dL — ABNORMAL LOW (ref 3.5–5.0)
Alkaline Phosphatase: 162 IU/L — ABNORMAL HIGH (ref 48–121)
Alkaline Phosphatase: 147 U/L — ABNORMAL HIGH (ref 38–126)
Alkaline Phosphatase: 130 U/L — ABNORMAL HIGH (ref 38–126)
Anion gap: 10 (ref 5–15)
Anion gap: 11 (ref 5–15)
BUN/Creatinine Ratio: 7 — ABNORMAL LOW (ref 9–23)
BUN: 5 mg/dL — ABNORMAL LOW (ref 6–20)
BUN: 7 mg/dL (ref 6–20)
BUN: 6 mg/dL (ref 6–20)
Bilirubin Total: 0.5 mg/dL (ref 0.0–1.2)
CO2: 20 mmol/L (ref 20–29)
CO2: 23 mmol/L (ref 22–32)
CO2: 21 mmol/L — ABNORMAL LOW (ref 22–32)
Calcium: 9.2 mg/dL (ref 8.7–10.2)
Calcium: 9.9 mg/dL (ref 8.9–10.3)
Calcium: 9.4 mg/dL (ref 8.9–10.3)
Chloride: 101 mmol/L (ref 96–106)
Chloride: 103 mmol/L (ref 98–111)
Chloride: 104 mmol/L (ref 98–111)
Creatinine, Ser: 0.71 mg/dL (ref 0.57–1.00)
Creatinine, Ser: 0.73 mg/dL (ref 0.44–1.00)
Creatinine, Ser: 0.64 mg/dL (ref 0.44–1.00)
GFR calc Af Amer: 132 mL/min/{1.73_m2} (ref >59)
GFR calc Af Amer: >60 mL/min (ref >60)
GFR calc Af Amer: >60 mL/min (ref >60)
GFR calc non Af Amer: 115 mL/min/{1.73_m2} (ref >59)
GFR calc non Af Amer: >60 mL/min (ref >60)
GFR calc non Af Amer: >60 mL/min (ref >60)
Globulin, Total: 2.6 g/dL (ref 1.5–4.5)
Glucose, Bld: 131 mg/dL — ABNORMAL HIGH (ref 70–99)
Glucose, Bld: 119 mg/dL — ABNORMAL HIGH (ref 70–99)
Glucose: 86 mg/dL (ref 65–99)
Potassium: 4.2 mmol/L (ref 3.5–5.2)
Potassium: 4.1 mmol/L (ref 3.5–5.1)
Potassium: 4.4 mmol/L (ref 3.5–5.1)
Sodium: 137 mmol/L (ref 134–144)
Sodium: 136 mmol/L (ref 135–145)
Sodium: 136 mmol/L (ref 135–145)
Total Bilirubin: 0.6 mg/dL (ref 0.3–1.2)
Total Bilirubin: 0.6 mg/dL (ref 0.3–1.2)
Total Protein: 6.3 g/dL (ref 6.0–8.5)
Total Protein: 6.8 g/dL (ref 6.5–8.1)
Total Protein: 6.1 g/dL — ABNORMAL LOW (ref 6.5–8.1)

## 2020-07-09 LAB — CBC
HCT: 32.3 % — ABNORMAL LOW (ref 36.0–46.0)
HCT: 33.7 % — ABNORMAL LOW (ref 36.0–46.0)
Hematocrit: 34.2 % (ref 34.0–46.6)
Hemoglobin: 11 g/dL — ABNORMAL LOW (ref 11.1–15.9)
Hemoglobin: 10 g/dL — ABNORMAL LOW (ref 12.0–15.0)
Hemoglobin: 10.5 g/dL — ABNORMAL LOW (ref 12.0–15.0)
MCH: 27.3 pg (ref 26.6–33.0)
MCH: 27.3 pg (ref 26.0–34.0)
MCH: 27.4 pg (ref 26.0–34.0)
MCHC: 32.2 g/dL (ref 31.5–35.7)
MCHC: 31 g/dL (ref 30.0–36.0)
MCHC: 31.2 g/dL (ref 30.0–36.0)
MCV: 85 fL (ref 79–97)
MCV: 88.3 fL (ref 80.0–100.0)
MCV: 88 fL (ref 80.0–100.0)
Platelets: 249 10*3/uL (ref 150–450)
Platelets: 248 10*3/uL (ref 150–400)
Platelets: 266 10*3/uL (ref 150–400)
RBC: 4.03 x10E6/uL (ref 3.77–5.28)
RBC: 3.66 MIL/uL — ABNORMAL LOW (ref 3.87–5.11)
RBC: 3.83 MIL/uL — ABNORMAL LOW (ref 3.87–5.11)
RDW: 18.3 % — ABNORMAL HIGH (ref 11.7–15.4)
RDW: 20 % — ABNORMAL HIGH (ref 11.5–15.5)
RDW: 20.2 % — ABNORMAL HIGH (ref 11.5–15.5)
WBC: 11.3 10*3/uL — ABNORMAL HIGH (ref 3.4–10.8)
WBC: 12.8 10*3/uL — ABNORMAL HIGH (ref 4.0–10.5)
WBC: 12.9 10*3/uL — ABNORMAL HIGH (ref 4.0–10.5)
nRBC: 0 % (ref 0.0–0.2)
nRBC: 0 % (ref 0.0–0.2)

## 2020-07-09 LAB — GC/CHLAMYDIA PROBE AMP (~~LOC~~) NOT AT ARMC
Chlamydia: NEGATIVE
Comment: NEGATIVE
Comment: NORMAL
Neisseria Gonorrhea: NEGATIVE

## 2020-07-09 LAB — TYPE AND SCREEN
ABO/RH(D): A NEG
Antibody Screen: POSITIVE

## 2020-07-09 LAB — SARS CORONAVIRUS 2 BY RT PCR (HOSPITAL ORDER, PERFORMED IN ~~LOC~~ HOSPITAL LAB): SARS Coronavirus 2: NEGATIVE

## 2020-07-09 MED ORDER — MISOPROSTOL 25 MCG QUARTER TABLET
25.0000 ug | ORAL_TABLET | ORAL | Status: DC | PRN
  Filled 2020-07-09: qty 1

## 2020-07-09 MED ORDER — BETAMETHASONE SOD PHOS & ACET 6 (3-3) MG/ML IJ SUSP
12.0000 mg | INTRAMUSCULAR | Status: AC
  Administered 2020-07-09: 12 mg via INTRAMUSCULAR
  Filled 2020-07-09: qty 5

## 2020-07-09 MED ORDER — ACETAMINOPHEN 325 MG PO TABS
650.0000 mg | ORAL_TABLET | ORAL | Status: DC | PRN
  Administered 2020-07-10: 650 mg via ORAL
  Filled 2020-07-09: qty 2

## 2020-07-09 MED ORDER — OXYTOCIN-SODIUM CHLORIDE 30-0.9 UT/500ML-% IV SOLN: 2.5000 [IU]/h | INTRAVENOUS | Status: DC

## 2020-07-09 MED ORDER — ZOLPIDEM TARTRATE 5 MG PO TABS: 5.0000 mg | ORAL_TABLET | Freq: Every evening | ORAL | Status: DC | PRN

## 2020-07-09 MED ORDER — DIPHENHYDRAMINE HCL 12.5 MG/5ML PO ELIX
12.5000 mg | ORAL_SOLUTION | Freq: Once | ORAL | Status: AC
  Administered 2020-07-10: 12.5 mg via ORAL
  Filled 2020-07-09: qty 5

## 2020-07-09 MED ORDER — LACTATED RINGERS IV SOLN: 500.0000 mL | INTRAVENOUS | Status: DC | PRN

## 2020-07-09 MED ORDER — TERBUTALINE SULFATE 1 MG/ML IJ SOLN: 0.2500 mg | Freq: Once | INTRAMUSCULAR | Status: DC | PRN

## 2020-07-09 MED ORDER — LABETALOL HCL 5 MG/ML IV SOLN: 80.0000 mg | INTRAVENOUS | Status: DC | PRN

## 2020-07-09 MED ORDER — LACTATED RINGERS IV SOLN: INTRAVENOUS | Status: DC

## 2020-07-09 MED ORDER — SOD CITRATE-CITRIC ACID 500-334 MG/5ML PO SOLN
30.0000 mL | ORAL | Status: DC | PRN
  Administered 2020-07-09 – 2020-07-10 (×2): 30 mL via ORAL
  Filled 2020-07-09 (×2): qty 30

## 2020-07-09 MED ORDER — CALCIUM CARBONATE ANTACID 500 MG PO CHEW: 2.0000 | CHEWABLE_TABLET | ORAL | Status: DC | PRN

## 2020-07-09 MED ORDER — BETAMETHASONE SOD PHOS & ACET 6 (3-3) MG/ML IJ SUSP: 12.0000 mg | Freq: Once | INTRAMUSCULAR | Status: DC

## 2020-07-09 MED ORDER — LIDOCAINE HCL (PF) 1 % IJ SOLN: 30.0000 mL | INTRAMUSCULAR | Status: DC | PRN

## 2020-07-09 MED ORDER — PRENATAL MULTIVITAMIN CH: 1.0000 | ORAL_TABLET | Freq: Every day | ORAL | Status: DC

## 2020-07-09 MED ORDER — MISOPROSTOL 25 MCG QUARTER TABLET
25.0000 ug | ORAL_TABLET | Freq: Once | ORAL | Status: AC
  Administered 2020-07-09: 25 ug via VAGINAL
  Filled 2020-07-09: qty 1

## 2020-07-09 MED ORDER — MAGNESIUM SULFATE BOLUS VIA INFUSION
4.0000 g | Freq: Once | INTRAVENOUS | Status: AC
  Administered 2020-07-09: 4 g via INTRAVENOUS
  Filled 2020-07-09: qty 1000

## 2020-07-09 MED ORDER — HYDRALAZINE HCL 20 MG/ML IJ SOLN: 10.0000 mg | INTRAMUSCULAR | Status: DC | PRN

## 2020-07-09 MED ORDER — PENICILLIN G POT IN DEXTROSE 60000 UNIT/ML IV SOLN
3.0000 10*6.[IU] | INTRAVENOUS | Status: DC
  Administered 2020-07-10 – 2020-07-11 (×9): 3 10*6.[IU] via INTRAVENOUS
  Filled 2020-07-09 (×10): qty 50

## 2020-07-09 MED ORDER — OXYTOCIN BOLUS FROM INFUSION
333.0000 mL | Freq: Once | INTRAVENOUS | Status: AC
  Administered 2020-07-11: 333 mL via INTRAVENOUS

## 2020-07-09 MED ORDER — OXYCODONE-ACETAMINOPHEN 5-325 MG PO TABS: 2.0000 | ORAL_TABLET | ORAL | Status: DC | PRN

## 2020-07-09 MED ORDER — ONDANSETRON HCL 4 MG/2ML IJ SOLN: 4.0000 mg | Freq: Four times a day (QID) | INTRAMUSCULAR | Status: DC | PRN

## 2020-07-09 MED ORDER — MISOPROSTOL 50MCG HALF TABLET
50.0000 ug | ORAL_TABLET | Freq: Once | ORAL | Status: AC
  Administered 2020-07-09: 50 ug via BUCCAL
  Filled 2020-07-09: qty 1

## 2020-07-09 MED ORDER — OXYCODONE-ACETAMINOPHEN 5-325 MG PO TABS: 1.0000 | ORAL_TABLET | ORAL | Status: DC | PRN

## 2020-07-09 MED ORDER — SODIUM CHLORIDE 0.9 % IV SOLN
5.0000 10*6.[IU] | Freq: Once | INTRAVENOUS | Status: AC
  Administered 2020-07-09: 5 10*6.[IU] via INTRAVENOUS
  Filled 2020-07-09: qty 5

## 2020-07-09 MED ORDER — ACETAMINOPHEN 325 MG PO TABS: 650.0000 mg | ORAL_TABLET | ORAL | Status: DC | PRN

## 2020-07-09 MED ORDER — OXYTOCIN-SODIUM CHLORIDE 30-0.9 UT/500ML-% IV SOLN
1.0000 m[IU]/min | INTRAVENOUS | Status: DC
  Administered 2020-07-09: 2 m[IU]/min via INTRAVENOUS
  Administered 2020-07-11: 21 m[IU]/min via INTRAVENOUS
  Filled 2020-07-09: qty 500

## 2020-07-09 MED ORDER — DOCUSATE SODIUM 100 MG PO CAPS: 100.0000 mg | ORAL_CAPSULE | Freq: Every day | ORAL | Status: DC

## 2020-07-09 MED ORDER — ESCITALOPRAM OXALATE 10 MG PO TABS
10.0000 mg | ORAL_TABLET | Freq: Every day | ORAL | Status: DC
  Administered 2020-07-09 – 2020-07-13 (×5): 10 mg via ORAL
  Filled 2020-07-09 (×6): qty 1

## 2020-07-09 MED ORDER — LABETALOL HCL 5 MG/ML IV SOLN
20.0000 mg | INTRAVENOUS | Status: DC | PRN
  Administered 2020-07-09: 20 mg via INTRAVENOUS
  Filled 2020-07-09: qty 4

## 2020-07-09 MED ORDER — LABETALOL HCL 5 MG/ML IV SOLN: 40.0000 mg | INTRAVENOUS | Status: DC | PRN

## 2020-07-09 MED ORDER — OXYTOCIN-SODIUM CHLORIDE 30-0.9 UT/500ML-% IV SOLN
1.0000 m[IU]/min | INTRAVENOUS | Status: DC
  Filled 2020-07-09: qty 500

## 2020-07-09 MED ORDER — LABETALOL HCL 5 MG/ML IV SOLN: 20.0000 mg | INTRAVENOUS | Status: DC | PRN

## 2020-07-09 MED ORDER — MAGNESIUM SULFATE 40 GM/1000ML IV SOLN
2.0000 g/h | INTRAVENOUS | Status: DC
  Administered 2020-07-09 – 2020-07-10 (×2): 2 g/h via INTRAVENOUS
  Filled 2020-07-09 (×3): qty 1000

## 2020-07-09 NOTE — Progress Notes (Signed)
Labor Progress Note Helyn Schwan is a 29 y.o. G1P0 at [redacted]w[redacted]d presented for preeclampsia with severe features based on severe range blood pressures.  S: On arrival to L&D pt denies HA, vision changes, chest pain, SOB, or RUQ pain.   O:  BP (!) 163/97   Pulse (!) 102   Temp 97.8 F (36.6 C) (Oral)   Resp 18   Ht 5' 2.5" (1.588 m)   Wt 94.8 kg   LMP 11/01/2019 (Exact Date)   SpO2 99%   BMI 37.62 kg/m  EFM: baseline 120/moderate variability/+accels/no decels, Reactive strip  CVE:  1/80/-2 @1130   [name]: 29 y.o. G1P0 [redacted]w[redacted]d presented for preeclampsia with severe features based on blood pressures. #Labor: Induction as noted above. FB placed at 1130 with subsequent dose of cytotec. Plan to transition to pitocin s/p removal of FB. #Pain: plan for epidural. #FWB: Reactive  #GBS unknown; will plan for penicillin given preterm--plan to start penicillin s/p FB out #Preeclampsia with severe features: Decision to induce today secondary to persistently elevated severe range Bps. Reassuringly, HELLP labs appropriate today and no concerning symptoms on arrival to L&D. S/p BMZ x2 (last dose at 1200 today). S/p Mag bolus and will continue infusion until 24h s/p delivery. IV antihypertensives as clinically indicated.  #Anxiety: continue home lexapro 10mg  qHS. Pt also takes ativan 1mg  prn. #Anemia: po iron in pregnancy. H&H 11.1 & 35.8 on 8/19.  , MD 12:02 PM

## 2020-07-09 NOTE — Progress Notes (Signed)
Labor Progress Note - Late Entry Hannah Campos is a 29 y.o. G1P0 at [redacted]w[redacted]d presented for pre-eclampsia with severe features based on severe range blood pressures S: Patient is doing well. Having some back pain from contractions, would like to try peanut ball and adjust positions.  O:  BP (!) 147/87    Pulse 99    Temp 98.1 F (36.7 C) (Oral)    Resp 18    Ht 5' 2.5" (1.588 m)    Wt 94.8 kg    LMP 11/01/2019 (Exact Date)    SpO2 99%    BMI 37.62 kg/m  EFM: baseline 115/moderate variability/+accels, no decels. Reactive  CVE: Dilation: 1 Exam by:: Breyton Vanscyoc, Resident   [name]: 29 y.o. G1P0 [redacted]w[redacted]d IOL for pre-eclampsia with severe-range blood pressures #Labor: Foley balloon is still in. Gave dose of vaginal cytotec @1613  (2 total doses of cytotec). Will continue to monitor. Will consider starting Pit once cervix is more favorable. #Pain: per patients request, would like Epidural  #FWB: Reactive #GBS Unknown; will plan to start PCN once foley balloon is out #Pre-eclampsia with severe features: S/p two doses of BMZ, Mg bolus. Still hypertensive to 140's systolic, last BP 147/87.  #Anxiety: Home lexapro 10mg  qHS. 1mg  Ativan PRN panic attacks #Anemia: PO iron supplementation during pregnancy. Most receng Hgb 8/19 of 11.1.   , DO 5:31 PM

## 2020-07-09 NOTE — H&P (Signed)
Chief Complaint:  Hypertension   First Provider Initiated Contact with Patient 07/08/20 2227     HPI: Hannah Campos is a 29 y.o. G1P0 at 10w5dwho presents to maternity admissions reporting elevated blood pressures at home.  Has been followed at [facility] for gradually increasing GHTN since  07/01/20 by Dr Adrian Blackwater.   Protein/Creat was 366 on 07/01/20, so diagnosis was changed to preeclampsia and IOL was scheduled for 37 weeks.  She was seen at [facility] today for BPP/growth and BPs were noted to be more elevated.  THey gave her a first dose of Betamethasone and recommended she check her BP frequently at home.  She became worried and presented here.  . She reports good fetal movement, denies LOF, vaginal bleeding, vaginal itching/burning, urinary symptoms, h/a, dizziness, n/v, diarrhea, constipation or fever/chills.  She denies headache, visual changes or RUQ abdominal pain.  Hypertension This is a recurrent problem. The current episode started 1 to 4 weeks ago. The problem has been gradually worsening since onset. Associated symptoms include anxiety and peripheral edema. Pertinent negatives include no blurred vision, chest pain, headaches, malaise/fatigue or shortness of breath. There are no associated agents to hypertension. There are no known risk factors for coronary artery disease. Past treatments include nothing. There are no compliance problems.     RN Note: I have preeclampsia. I had appts at my ob today and MFM and my b/p was high at both visits. Had BMZ today. Alittle mild pressue in pelvis but no other pain. Cervix was 0.5cm today and 80% effaced. No headaches, epigastric pain or any other concerns  Past Medical History: Past Medical History:  Diagnosis Date  . Anemia   . Hypertension     Past obstetric history: OB History  Gravida Para Term Preterm AB Living  1         0  SAB TAB Ectopic Multiple Live Births               # Outcome Date GA Lbr Len/2nd Weight Sex Delivery Anes PTL Lv   1 Current             Past Surgical History: Past Surgical History:  Procedure Laterality Date  . soft cleft palate repair      Family History: Family History  Problem Relation Age of Onset  . Hypertension Father     Social History: Social History   Tobacco Use  . Smoking status: Former Smoker    Years: 1.00    Types: E-cigarettes    Quit date: 11/28/2019    Years since quitting: 0.6  . Smokeless tobacco: Former Neurosurgeon    Quit date: 11/21/2019  . Tobacco comment: none since January  Vaping Use  . Vaping Use: Former  . Start date: 12/05/2019  Substance Use Topics  . Alcohol use: Not Currently  . Drug use: Never    Allergies:  Allergies  Allergen Reactions  . Adhesive [Tape] Other (See Comments)    Swelling where it was applied    Meds:  Medications Prior to Admission  Medication Sig Dispense Refill Last Dose  . cyclobenzaprine (FLEXERIL) 10 MG tablet Take 10 mg by mouth 3 (three) times daily as needed. (Patient not taking: Reported on 07/08/2020)     . diphenhydrAMINE (BENADRYL) 25 mg capsule Take 25 mg by mouth every 6 (six) hours as needed.     Marland Kitchen escitalopram (LEXAPRO) 10 MG tablet 10 mg.     . LORazepam (ATIVAN) 1 MG tablet 1 mg.     Marland Kitchen  Prenatal Vit-Fe Fumarate-FA (PRENATAL VITAMINS PO) Take by mouth.     Marland Kitchen PRENATAL VIT-FE FUMARATE-FA PO Take by mouth.       I have reviewed patient's Past Medical Hx, Surgical Hx, Family Hx, Social Hx, medications and allergies.   ROS:  Review of Systems  Constitutional: Negative for malaise/fatigue.  Eyes: Negative for blurred vision.  Respiratory: Negative for shortness of breath.   Cardiovascular: Negative for chest pain.  Neurological: Negative for headaches.   Other systems negative  Physical Exam   Patient Vitals for the past 24 hrs:  BP Temp Pulse Resp Height Weight  07/08/20 2223 (!) 152/100 -- (!) 115 -- -- --  07/08/20 2222 (!) 152/100 -- (!) 115 -- -- --  07/08/20 2212 (!) 156/97 -- (!) 110 -- -- --   07/08/20 2211 -- -- -- -- 5' 2.5" (1.588 m) 94.8 kg  07/08/20 2204 -- 97.7 F (36.5 C) -- 18 -- --   Vitals:   07/08/20 2345 07/09/20 0000 07/09/20 0015 07/09/20 0030  BP: (!) 153/85 (!) 145/87 (!) 161/91 (!) 150/87  Pulse: 94 95 (!) 101 96  Resp:      Temp:      SpO2:  98% 98% 98%  Weight:      Height:        Constitutional: Well-developed, well-nourished female in no acute distress.  Cardiovascular: normal rate and rhythm Respiratory: normal effort, clear to auscultation bilaterally GI: Abd soft, non-tender, gravid appropriate for gestational age.   No rebound or guarding. MS: Extremities nontender, no edema, normal ROM Neurologic: Alert and oriented x 4.  GU: Neg CVAT.  PELVIC EXAM: deferred  FHT:  Baseline 130 , moderate variability, accelerations present, no decelerations Contractions: q 3 mins Irregular    Labs: Results for orders placed or performed during the hospital encounter of 07/08/20 (from the past 24 hour(s))  Urinalysis, Routine w reflex microscopic Urine, Clean Catch     Status: Abnormal   Collection Time: 07/08/20 10:11 PM  Result Value Ref Range   Color, Urine YELLOW YELLOW   APPearance CLEAR CLEAR   Specific Gravity, Urine 1.015 1.005 - 1.030   pH 7.0 5.0 - 8.0   Glucose, UA NEGATIVE NEGATIVE mg/dL   Hgb urine dipstick NEGATIVE NEGATIVE   Bilirubin Urine NEGATIVE NEGATIVE   Ketones, ur 80 (A) NEGATIVE mg/dL   Protein, ur NEGATIVE NEGATIVE mg/dL   Nitrite NEGATIVE NEGATIVE   Leukocytes,Ua NEGATIVE NEGATIVE  CBC     Status: Abnormal   Collection Time: 07/08/20 10:27 PM  Result Value Ref Range   WBC 11.3 (H) 4.0 - 10.5 K/uL   RBC 4.05 3.87 - 5.11 MIL/uL   Hemoglobin 11.1 (L) 12.0 - 15.0 g/dL   HCT 16.1 (L) 36 - 46 %   MCV 88.4 80.0 - 100.0 fL   MCH 27.4 26.0 - 34.0 pg   MCHC 31.0 30.0 - 36.0 g/dL   RDW 09.6 (H) 04.5 - 40.9 %   Platelets 277 150 - 400 K/uL   nRBC 0.0 0.0 - 0.2 %  Comprehensive metabolic panel     Status: Abnormal    Collection Time: 07/08/20 10:27 PM  Result Value Ref Range   Sodium 136 135 - 145 mmol/L   Potassium 4.1 3.5 - 5.1 mmol/L   Chloride 103 98 - 111 mmol/L   CO2 23 22 - 32 mmol/L   Glucose, Bld 131 (H) 70 - 99 mg/dL   BUN 7 6 - 20 mg/dL   Creatinine,  Ser 0.73 0.44 - 1.00 mg/dL   Calcium 9.9 8.9 - 71.2 mg/dL   Total Protein 6.8 6.5 - 8.1 g/dL   Albumin 2.9 (L) 3.5 - 5.0 g/dL   AST 61 (H) 15 - 41 U/L   ALT 30 0 - 44 U/L   Alkaline Phosphatase 147 (H) 38 - 126 U/L   Total Bilirubin 0.6 0.3 - 1.2 mg/dL   GFR calc non Af Amer >60 >60 mL/min   GFR calc Af Amer >60 >60 mL/min   Anion gap 10 5 - 15  Protein / creatinine ratio, urine     Status: None   Collection Time: 07/08/20 10:27 PM  Result Value Ref Range   Creatinine, Urine 112.63 mg/dL   Total Protein, Urine 16 mg/dL   Protein Creatinine Ratio 0.14 0.00 - 0.15 mg/mg[Cre]    Imaging:  [place] MFM FETAL BPP WO NON STRESS  Result Date: 07/08/2020 ----------------------------------------------------------------------  OBSTETRICS REPORT                       (Signed Final 07/08/2020 05:27 pm) ---------------------------------------------------------------------- Patient Info  ID #:       [id number]                          D.O.B.:  07/22/1991 (29 yrs)  Name:       Hannah Campos                Visit Date: 07/08/2020 01:48 pm ---------------------------------------------------------------------- Performed By  Attending:        Ma Rings MD         Secondary Phy.:   [facility] [place]  Performed By:     Emeline Darling BS,      Address:          [address]                                                             [address]  Referred By:      Levie Heritage        Location[facility]         [facility]                    MD                                       [facility]                                                             [facility]  Ref. Address:      [address]                    [place]                    [phone] ---------------------------------------------------------------------- Orders  #  Description                           Code        Ordered By  1  US MFM OB DETAIL +14 WK               76811.01    Candelaria CelesteJACOB STINSON  2  US MFM FETAL BPP WO NON               76819.01    JACOB STINSON     STRESS ----------------------------------------------------------------------  #  Order #                     Accession #                Episode #  1  [id number]                   [id number]                 [id number]  2  [id number]                   [id number]                 [id number] ---------------------------------------------------------------------- Indications  Pre-eclampsia, third trimester                 O14.90  [redacted] weeks gestation of pregnancy                Z3A.35  Encounter for antenatal screening for          Z36.3  malformations  Obesity complicating pregnancy, third          O99.213  trimester  Rh negative state in antepartum                O36.0190  Anemia during pregnancy in third trimester     O99.013 ---------------------------------------------------------------------- Vital Signs  Weight (lb): 209                               Height:        5'2"  BMI:         38.22 ---------------------------------------------------------------------- Fetal Evaluation  Num Of Fetuses:         1  Fetal Heart Rate(bpm):  153  Cardiac Activity:       Observed  Presentation:           Cephalic  Placenta:               Posterior Fundal  P. Cord Insertion:      Not well visualized  Amniotic Fluid  AFI FV:      Within normal limits  AFI Sum(cm)     %Tile       Largest Pocket(cm)  16.45           61  7.2  RUQ(cm)       RLQ(cm)       LUQ(cm)        LLQ(cm)  7.2           1.04          3.48           4.73 ---------------------------------------------------------------------- Biophysical Evaluation  Amniotic F.V:   Pocket => 2 cm              F. Tone:        Observed  F. Movement:    Observed                   Score:          8/8  F. Breathing:   Observed ---------------------------------------------------------------------- Biometry  BPD:      90.4  mm     G. Age:  36w 4d         80  %    CI:        78.12   %    70 - 86                                                          FL/HC:      20.6   %    20.1 - 22.1  HC:      323.6  mm     G. Age:  36w 4d         39  %    HC/AC:      0.95        0.93 - 1.11  AC:      341.8  mm     G. Age:  38w 1d         98  %    FL/BPD:     73.9   %    71 - 87  FL:       66.8  mm     G. Age:  34w 3d         14  %    FL/AC:      19.5   %    20 - 24  Est. FW:    3058  gm    6 lb 12 oz      81  % ---------------------------------------------------------------------- OB History  Gravidity:    1 ---------------------------------------------------------------------- Gestational Age  LMP:           35w 5d        Date:  11/01/19                 EDD:   08/07/20  U/S Today:     36w 3d                                        EDD:   08/02/20  Best:          35w 5d     Det. By:  LMP  (11/01/19)          EDD:   08/07/20 ---------------------------------------------------------------------- Anatomy  Cranium:               Appears normal  Aortic Arch:            Appears normal  Cavum:                 Appears normal         Ductal Arch:            Appears normal  Ventricles:            Appears normal         Diaphragm:              Appears normal  Choroid Plexus:        Appears normal         Stomach:                Appears normal, left                                                                        sided  Cerebellum:            Appears normal         Abdomen:                Appears normal  Posterior Fossa:       Not well visualized    Abdominal Wall:         Not well visualized  Nuchal Fold:           Not well visualized    Cord Vessels:           Appears normal (3                                                                         vessel cord)  Face:                  Orbits appear          Kidneys:                Appear normal                         normal  Lips:                  Appears normal         Bladder:                Appears normal  Thoracic:              Appears normal         Spine:                  Limited views  appear normal  Heart:                 Not well visualized    Upper Extremities:      Not well visualized  RVOT:                  Not well visualized    Lower Extremities:      Visualized  LVOT:                  Not well visualized  Other:  Nasal bone and Right Heel visualized. Technically difficult due to          advanced GA and fetal position. Technically difficult due to maternal          habitus. ---------------------------------------------------------------------- Cervix Uterus Adnexa  Cervix  Not visualized (advanced GA >24wks) ---------------------------------------------------------------------- Comments  This patient was seen for an ultrasound exam due to recently  diagnosed preeclampsia.  Her P/C ratio performed last week  indicated 366 mg of protein.  The patient reports that she also  has severe anxiety which may contribute to her elevated  blood pressures.  The patient's initial blood pressure in our  office was 145/110.  On repeat it was 157/97.  She denies  any signs or symptoms of severe preeclampsia.  She had  PIH labs drawn this morning.  The results of those labs are  currently pending.  She was informed that the fetal growth and amniotic fluid  level appears appropriate for her gestational age.  The views  of the fetal anatomy were limited today due to her advanced  gestational age.  A biophysical profile performed today was 8 out of 8.  Due to preeclampsia with elevated blood pressures, I will give  the patient a complete course of antenatal corticosteroids  starting today.  She will return to our office tomorrow to   receive the second dose.  Preeclampsia precautions were reviewed with the patient  today.  She was advised to monitor her blood pressures at  home and to go to the hospital should her blood pressures be  persistently greater than 150s over high 90s.  She already  has an induction scheduled in 9 days at around 37 weeks.  Delivery prior to 37 weeks would be indicated should she  complain of any signs or symptoms of preeclampsia, should  her PIH labs show any abnormalities, or should her blood  pressures continue to increase. ----------------------------------------------------------------------                   Ma Rings, MD Electronically Signed Final Report   07/08/2020 05:27 pm ----------------------------------------------------------------------  [place] MFM OB DETAIL +14 WK  Result Date: 07/08/2020 ----------------------------------------------------------------------  OBSTETRICS REPORT                       (Signed Final 07/08/2020 05:27 pm) ---------------------------------------------------------------------- Patient Info  ID #:       [id number]                          D.O.B.:  1991/01/25 (29 yrs)  Name:       Hannah Campos                Visit Date: 07/08/2020 01:48 pm ---------------------------------------------------------------------- Performed By  Attending:        Ma Rings MD         Secondary Phy.:   [facility] [place]  Performed By:     Emeline Darling BS,      Address:          [address]                    [place]                                                             [address]  Referred By:      Levie Heritage        Location[facility]         [facility]                    MD                                       [facility]  Ref. Address:     [address]                    [place]                    [phone]  ---------------------------------------------------------------------- Orders  #  Description                           Code        Ordered By  1  [place] MFM OB DETAIL +14 WK               [id number]    Candelaria Celeste  2  [place] MFM FETAL BPP WO NON               76819.01    JACOB STINSON     STRESS ----------------------------------------------------------------------  #  Order #                     Accession #                Episode #  1  [id number]                   [id number]                 [id number]  2  [id number]  [id number]                 [id number] ---------------------------------------------------------------------- Indications  Pre-eclampsia, third trimester                 O14.90  [redacted] weeks gestation of pregnancy                Z3A.35  Encounter for antenatal screening for          Z36.3  malformations  Obesity complicating pregnancy, third          O99.213  trimester  Rh negative state in antepartum                O36.0190  Anemia during pregnancy in third trimester     O99.013 ---------------------------------------------------------------------- Vital Signs  Weight (lb): 209                               Height:        5'2"  BMI:         38.22 ---------------------------------------------------------------------- Fetal Evaluation  Num Of Fetuses:         1  Fetal Heart Rate(bpm):  153  Cardiac Activity:       Observed  Presentation:           Cephalic  Placenta:               Posterior Fundal  P. Cord Insertion:      Not well visualized  Amniotic Fluid  AFI FV:      Within normal limits  AFI Sum(cm)     %Tile       Largest Pocket(cm)  16.45           61          7.2  RUQ(cm)       RLQ(cm)       LUQ(cm)        LLQ(cm)  7.2           1.04          3.48           4.73 ---------------------------------------------------------------------- Biophysical Evaluation  Amniotic F.V:   Pocket => 2 cm             F. Tone:        Observed  F. Movement:    Observed                   Score:          8/8  F.  Breathing:   Observed ---------------------------------------------------------------------- Biometry  BPD:      90.4  mm     G. Age:  36w 4d         80  %    CI:        78.12   %    70 - 86                                                          FL/HC:      20.6   %    20.1 - 22.1  HC:      323.6  mm     G. Age:  36w 4d  39  %    HC/AC:      0.95        0.93 - 1.11  AC:      341.8  mm     G. Age:  38w 1d         98  %    FL/BPD:     73.9   %    71 - 87  FL:       66.8  mm     G. Age:  34w 3d         14  %    FL/AC:      19.5   %    20 - 24  Est. FW:    3058  gm    6 lb 12 oz      81  % ---------------------------------------------------------------------- OB History  Gravidity:    1 ---------------------------------------------------------------------- Gestational Age  LMP:           35w 5d        Date:  11/01/19                 EDD:   08/07/20  U/S Today:     36w 3d                                        EDD:   08/02/20  Best:          35w 5d     Det. By:  LMP  (11/01/19)          EDD:   08/07/20 ---------------------------------------------------------------------- Anatomy  Cranium:               Appears normal         Aortic Arch:            Appears normal  Cavum:                 Appears normal         Ductal Arch:            Appears normal  Ventricles:            Appears normal         Diaphragm:              Appears normal  Choroid Plexus:        Appears normal         Stomach:                Appears normal, left                                                                        sided  Cerebellum:            Appears normal         Abdomen:                Appears normal  Posterior Fossa:       Not well visualized    Abdominal Wall:         Not well visualized  Nuchal Fold:  Not well visualized    Cord Vessels:           Appears normal (3                                                                        vessel cord)  Face:                  Orbits appear          Kidneys:                 Appear normal                         normal  Lips:                  Appears normal         Bladder:                Appears normal  Thoracic:              Appears normal         Spine:                  Limited views                                                                        appear normal  Heart:                 Not well visualized    Upper Extremities:      Not well visualized  RVOT:                  Not well visualized    Lower Extremities:      Visualized  LVOT:                  Not well visualized  Other:  Nasal bone and Right Heel visualized. Technically difficult due to          advanced GA and fetal position. Technically difficult due to maternal          habitus. ---------------------------------------------------------------------- Cervix Uterus Adnexa  Cervix  Not visualized (advanced GA >24wks) ---------------------------------------------------------------------- Comments  This patient was seen for an ultrasound exam due to recently  diagnosed preeclampsia.  Her P/C ratio performed last week  indicated 366 mg of protein.  The patient reports that she also  has severe anxiety which may contribute to her elevated  blood pressures.  The patient's initial blood pressure in our  office was 145/110.  On repeat it was 157/97.  She denies  any signs or symptoms of severe preeclampsia.  She had  PIH labs drawn this morning.  The results of those labs are  currently pending.  She was informed that the fetal growth and amniotic fluid  level appears appropriate for her gestational age.  The views  of the fetal  anatomy were limited today due to her advanced  gestational age.  A biophysical profile performed today was 8 out of 8.  Due to preeclampsia with elevated blood pressures, I will give  the patient a complete course of antenatal corticosteroids  starting today.  She will return to our office tomorrow to  receive the second dose.  Preeclampsia precautions were reviewed with the patient  today.  She  was advised to monitor her blood pressures at  home and to go to the hospital should her blood pressures be  persistently greater than 150s over high 90s.  She already  has an induction scheduled in 9 days at around 37 weeks.  Delivery prior to 37 weeks would be indicated should she  complain of any signs or symptoms of preeclampsia, should  her PIH labs show any abnormalities, or should her blood  pressures continue to increase. ----------------------------------------------------------------------                   Ma Rings, MD Electronically Signed Final Report   07/08/2020 05:27 pm ----------------------------------------------------------------------   MAU Course/MDM: I have ordered labs and reviewed results. AST is slightly elevated.  Pr/Cr is normal today NST reviewed, reassuring category I Consult Dr Donavan Foil with presentation, exam findings and test results. He recommends admission for observation. Treatments in MAU included EFM.    Assessment: Single IUP at [redacted]w[redacted]d Preeclampsia, borderline severe range BPs  Plan: Admit to [facility] Routine orders Second dose of Betamethasone tomorrow MD to follow   Wynelle Bourgeois CNM, MSN Certified Nurse-Midwife 07/08/2020 10:27 PM

## 2020-07-09 NOTE — Progress Notes (Signed)
Patient ID: Hannah Campos, female   DOB: 1991-04-15, 29 y.o.   MRN: [id number] Discussed pt with Dr. Ma Rings, due to severe range BP's, recommend to proceed toward delivery.  Discussed with pt and FOB recommendation. Understanding verbalized and agreed to IOL. Nursing, OB fellow, L & D and NICU aware.

## 2020-07-09 NOTE — Progress Notes (Signed)
Labor Progress Note Hannah Campos is a 29 y.o. G1P0 at [redacted]w[redacted]d presented for IOL for preE with SF based on BP.  S: Doing well overall, not feeling contractions.   O:  BP 131/74   Pulse (!) 108   Temp 98.1 F (36.7 C) (Oral)   Resp 16   Ht 5' 2.5" (1.588 m)   Wt 94.8 kg   LMP 11/01/2019 (Exact Date)   SpO2 99%   BMI 37.62 kg/m  EFM: 125/mod variability/+ accels/no decels  CVE: Dilation: 4 Effacement (%): 50 Station: -2 Presentation: Vertex Exam by:: Dr. Maryagnes Amos   [name]: 29 y.o. G1P0 [redacted]w[redacted]d presenting for IOL for preE with SF. #Labor: Progressing well. FB now out, will start penicillin for GBS unknown and preterm. Started Pitocin 2 x 2. S/p Cytotec x 2 doses. #Pain: Per patient request, would like epidural #FWB: Cat I #GBS unknown, starting penicillin  #Pre-eclampsia with severe features: S/p two doses of BMZ, Mg bolus. Still hypertensive to 140's systolic, last BP 145/85. #Anxiety: Home lexapro 10mg  qHS. 1mg  Ativan PRN panic attacks. Requesting Benadryl 12.5mg  to sleep. #Anemia: PO iron supplementation during pregnancy. Most receng Hgb 8/19 of 11.1.    , MD 10:02 PM

## 2020-07-09 NOTE — Progress Notes (Signed)
FACULTY PRACTICE ANTEPARTUM PROGRESS NOTE  Hannah Campos is a 29 y.o. G1P0 at [redacted]w[redacted]d who is admitted for preeclampsia without severe features, blood pressure exacerbation.  Estimated Date of Delivery: 08/07/20 Fetal presentation is cephalic as of 07/08/2020.  Length of Stay:  0 Days. Admitted 07/08/2020  Subjective: Pt seen, doing well.  No acute complaints currently.  She denies headache, visual changes and RUQ pain Patient reports normal fetal movement.  She denies uterine contractions, denies bleeding and leaking of fluid per vagina.  Vitals:  Blood pressure (!) 129/94, pulse (!) 107, temperature 98.1 F (36.7 C), temperature source Oral, resp. rate 18, height 5' 2.5" (1.588 m), weight 94.8 kg, last menstrual period 11/01/2019, SpO2 97 %. Physical Examination: CONSTITUTIONAL: Well-developed, well-nourished female in no acute distress.  HENT:  Normocephalic, atraumatic, External right and left ear normal. Oropharynx is clear and moist EYES: Conjunctivae and EOM are normal. Pupils are equal, round, and reactive to light. No scleral icterus.  NECK: Normal range of motion, supple, no masses. SKIN: Skin is warm and dry. No rash noted. Not diaphoretic. No erythema. No pallor. NEUROLGIC: Alert and oriented to person, place, and time. Normal reflexes, muscle tone coordination. No cranial nerve deficit noted.  DTR at patella 1+ CARDIOVASCULAR: Normal heart rate noted, regular rhythm RESPIRATORY: Effort and breath sounds normal, no problems with respiration noted MUSCULOSKELETAL: Normal range of motion. No edema and no tenderness. ABDOMEN: Soft, obese, nontender, nondistended, gravid. CERVIX: deferred  Fetal monitoring: FHR: 116 bpm, Variability: moderate, Accelerations: Present, Decelerations: Absent  Uterine activity: none  Results for orders placed or performed during the hospital encounter of 07/08/20 (from the past 48 hour(s))  Urinalysis, Routine w reflex microscopic Urine, Clean Catch      Status: Abnormal   Collection Time: 07/08/20 10:11 PM  Result Value Ref Range   Color, Urine YELLOW YELLOW   APPearance CLEAR CLEAR   Specific Gravity, Urine 1.015 1.005 - 1.030   pH 7.0 5.0 - 8.0   Glucose, UA NEGATIVE NEGATIVE mg/dL   Hgb urine dipstick NEGATIVE NEGATIVE   Bilirubin Urine NEGATIVE NEGATIVE   Ketones, ur 80 (A) NEGATIVE mg/dL   Protein, ur NEGATIVE NEGATIVE mg/dL   Nitrite NEGATIVE NEGATIVE   Leukocytes,Ua NEGATIVE NEGATIVE    Comment: Performed at [facility], [address] [place], [place] [address]  CBC     Status: Abnormal   Collection Time: 07/08/20 10:27 PM  Result Value Ref Range   WBC 11.3 (H) 4.0 - 10.5 K/uL   RBC 4.05 3.87 - 5.11 MIL/uL   Hemoglobin 11.1 (L) 12.0 - 15.0 g/dL   HCT 58.5 (L) 36 - 46 %   MCV 88.4 80.0 - 100.0 fL   MCH 27.4 26.0 - 34.0 pg   MCHC 31.0 30.0 - 36.0 g/dL   RDW 27.7 (H) 82.4 - 23.5 %   Platelets 277 150 - 400 K/uL   nRBC 0.0 0.0 - 0.2 %    Comment: Performed at [facility], [address] [place], [place] [address]  Comprehensive metabolic panel     Status: Abnormal   Collection Time: 07/08/20 10:27 PM  Result Value Ref Range   Sodium 136 135 - 145 mmol/L   Potassium 4.1 3.5 - 5.1 mmol/L   Chloride 103 98 - 111 mmol/L   CO2 23 22 - 32 mmol/L   Glucose, Bld 131 (H) 70 - 99 mg/dL    Comment: Glucose reference range applies only to samples taken after fasting for  at least 8 hours.   BUN 7 6 - 20 mg/dL   Creatinine, Ser 6.14 0.44 - 1.00 mg/dL   Calcium 9.9 8.9 - 43.1 mg/dL   Total Protein 6.8 6.5 - 8.1 g/dL   Albumin 2.9 (L) 3.5 - 5.0 g/dL   AST 61 (H) 15 - 41 U/L   ALT 30 0 - 44 U/L   Alkaline Phosphatase 147 (H) 38 - 126 U/L   Total Bilirubin 0.6 0.3 - 1.2 mg/dL   GFR calc non Af Amer >60 >60 mL/min   GFR calc Af Amer >60 >60 mL/min   Anion gap 10 5 - 15    Comment: Performed at [facility], [address] [place], [place] [address]  Protein / creatinine ratio, urine     Status: None    Collection Time: 07/08/20 10:27 PM  Result Value Ref Range   Creatinine, Urine 112.63 mg/dL   Total Protein, Urine 16 mg/dL    Comment: NO NORMAL RANGE ESTABLISHED FOR THIS TEST   Protein Creatinine Ratio 0.14 0.00 - 0.15 mg/mg[Cre]    Comment: Performed at [facility], [address], [place], [place] [address]  Type and screen [facility]     Status: None   Collection Time: 07/08/20 10:50 PM  Result Value Ref Range   ABO/RH(D) A NEG    Antibody Screen POS    Sample Expiration 07/11/2020,2359    Antibody Identification      PASSIVELY ACQUIRED ANTI-D Performed at [facility], [address] [place], [place] [address]   SARS Coronavirus 2 by RT PCR (hospital order, performed in [facility] hospital lab) Nasopharyngeal Nasopharyngeal Swab     Status: None   Collection Time: 07/09/20 12:11 AM   Specimen: Nasopharyngeal Swab  Result Value Ref Range   SARS Coronavirus 2 NEGATIVE NEGATIVE    Comment: (NOTE) SARS-CoV-2 target nucleic acids are NOT DETECTED.  The SARS-CoV-2 RNA is generally detectable in upper and lower respiratory specimens during the acute phase of infection. The lowest concentration of SARS-CoV-2 viral copies this assay can detect is 250 copies / mL. A negative result does not preclude SARS-CoV-2 infection and should not be used as the sole basis for treatment or other patient management decisions.  A negative result may occur with improper specimen collection / handling, submission of specimen other than nasopharyngeal swab, presence of viral mutation(s) within the areas targeted by this assay, and inadequate number of viral copies (<250 copies / mL). A negative result must be combined with clinical observations, patient history, and epidemiological information.  Fact Sheet for Patients:   BoilerBrush.com.cy  Fact Sheet for Healthcare Providers: https://pope.com/  This test is not  yet approved or  cleared by the [place] FDA and has been authorized for detection and/or diagnosis of SARS-CoV-2 by FDA under an Emergency Use Authorization (EUA).  This EUA will remain in effect (meaning this test can be used) for the duration of the COVID-19 declaration under Section 564(b)(1) of the Act, 21 U.S.C. section 360bbb-3(b)(1), unless the authorization is terminated or revoked sooner.  Performed at [facility], [address], [place], [place] [address]     I have reviewed the patient's current medications.  ASSESSMENT: Active Problems:   Preeclampsia   PLAN: Blood pressures currently in the mild range without medication.  No end organ symptoms currently. Repeat CBC, CMP Second BMZ at 1500 Reassess patient after betamethasone for continued inpatient stay versus home  Delivery at 37 weeks   Continue routine antenatal care.   Mariel Aloe, MD [facility] Faculty Attending, Center for [facility] 07/09/2020 7:10 AM

## 2020-07-10 ENCOUNTER — Inpatient Hospital Stay (HOSPITAL_COMMUNITY): Payer: BLUE CROSS/BLUE SHIELD | Admitting: Anesthesiology

## 2020-07-10 LAB — CBC
HCT: 34.4 % — ABNORMAL LOW (ref 36.0–46.0)
Hemoglobin: 10.8 g/dL — ABNORMAL LOW (ref 12.0–15.0)
MCH: 27.8 pg (ref 26.0–34.0)
MCHC: 31.4 g/dL (ref 30.0–36.0)
MCV: 88.7 fL (ref 80.0–100.0)
Platelets: 272 10*3/uL (ref 150–400)
RBC: 3.88 MIL/uL (ref 3.87–5.11)
RDW: 20.9 % — ABNORMAL HIGH (ref 11.5–15.5)
WBC: 14.8 10*3/uL — ABNORMAL HIGH (ref 4.0–10.5)
nRBC: 0 % (ref 0.0–0.2)

## 2020-07-10 MED ORDER — FAMOTIDINE IN NACL 20-0.9 MG/50ML-% IV SOLN
20.0000 mg | Freq: Two times a day (BID) | INTRAVENOUS | Status: DC
  Administered 2020-07-10 – 2020-07-11 (×3): 20 mg via INTRAVENOUS
  Filled 2020-07-10 (×3): qty 50

## 2020-07-10 MED ORDER — LACTATED RINGERS IV SOLN
500.0000 mL | Freq: Once | INTRAVENOUS | Status: AC
  Administered 2020-07-10: 500 mL via INTRAVENOUS

## 2020-07-10 MED ORDER — DIPHENHYDRAMINE HCL 50 MG/ML IJ SOLN: 12.5000 mg | INTRAMUSCULAR | Status: DC | PRN

## 2020-07-10 MED ORDER — EPHEDRINE 5 MG/ML INJ: 10.0000 mg | INTRAVENOUS | Status: DC | PRN

## 2020-07-10 MED ORDER — BUPIVACAINE HCL (PF) 0.75 % IJ SOLN
INTRAMUSCULAR | Status: DC | PRN
Start: 2020-07-10 — End: 2020-07-11
  Administered 2020-07-10: 12 mL/h via EPIDURAL

## 2020-07-10 MED ORDER — LABETALOL HCL 200 MG PO TABS
200.0000 mg | ORAL_TABLET | Freq: Two times a day (BID) | ORAL | Status: DC
  Administered 2020-07-10: 200 mg via ORAL
  Filled 2020-07-10: qty 1

## 2020-07-10 MED ORDER — LORAZEPAM 2 MG/ML IJ SOLN
1.0000 mg | Freq: Four times a day (QID) | INTRAMUSCULAR | Status: DC | PRN
  Administered 2020-07-10: 1 mg via INTRAVENOUS
  Filled 2020-07-10: qty 1

## 2020-07-10 MED ORDER — PHENYLEPHRINE 40 MCG/ML (10ML) SYRINGE FOR IV PUSH (FOR BLOOD PRESSURE SUPPORT)
80.0000 ug | PREFILLED_SYRINGE | INTRAVENOUS | Status: DC | PRN
  Filled 2020-07-10: qty 10

## 2020-07-10 MED ORDER — PHENYLEPHRINE 40 MCG/ML (10ML) SYRINGE FOR IV PUSH (FOR BLOOD PRESSURE SUPPORT): 80.0000 ug | PREFILLED_SYRINGE | INTRAVENOUS | Status: DC | PRN

## 2020-07-10 MED ORDER — FENTANYL-BUPIVACAINE-NACL 0.5-0.125-0.9 MG/250ML-% EP SOLN
12.0000 mL/h | EPIDURAL | Status: DC | PRN
  Administered 2020-07-11: 12 mL/h via EPIDURAL
  Filled 2020-07-10 (×2): qty 250

## 2020-07-10 MED ORDER — LORAZEPAM 2 MG/ML IJ SOLN
INTRAMUSCULAR | Status: AC
  Administered 2020-07-10: 1 mg via INTRAVENOUS
  Filled 2020-07-10: qty 1

## 2020-07-10 MED ORDER — LIDOCAINE HCL (PF) 1 % IJ SOLN
INTRAMUSCULAR | Status: DC | PRN
  Administered 2020-07-10: 9 mL via EPIDURAL

## 2020-07-10 MED ORDER — LORAZEPAM 2 MG/ML IJ SOLN: 1.0000 mg | Freq: Once | INTRAMUSCULAR | Status: AC

## 2020-07-10 NOTE — Progress Notes (Signed)
Labor Progress Note Hannah Campos is a 29 y.o. G1P0 at [redacted]w[redacted]d who presented for IOL for preE with SF based on BP.  S: Doing well overall, sleeping on entry to room. Not feeling any contractions.   O:  BP 122/61   Pulse 91   Temp 97.7 F (36.5 C) (Oral)   Resp 18   Ht 5' 2.5" (1.588 m)   Wt 94.8 kg   LMP 11/01/2019 (Exact Date)   SpO2 99%   BMI 37.62 kg/m  EFM: 110/mod variability/+ accels/no decels  CVE: Dilation: 5 Effacement (%): 50 Station: -1 Presentation: Vertex Exam by:: Sherol Dade RN   [name]: 29 y.o. G1P0 [redacted]w[redacted]d presenting for IOL for preE with SF based on BP.  #Labor: Progressing well, making cervical change. Pitocin at 10, will continue to increase rate per protocol and recheck in 4 hours or with clinical change. #Pain: Per patient request, would like epidural #FWB: Cat I #GBS unknown, receiving penicillin  #Pre-eclampsia with severe features: S/p two doses of BMZ, Mg bolus. Still hypertensive to 130's systolic, last BP 122/61. #Anxiety: Home lexapro 10mg  qHS. 1mg  Ativan PRN panic attacks. S/p Benadryl 12.5mg  to sleep. #Anemia: PO iron supplementation during pregnancy. Most receng Hgb 8/19 of 11.1.   , MD 4:04 AM

## 2020-07-10 NOTE — Progress Notes (Signed)
Labor Progress Note Hannah Campos is a 29 y.o. G1P0 at [redacted]w[redacted]d presented for IOL for preE with SF based on severe range blood pressures  S: Feeling anxious about the future parts of her labor process.   O:  BP (!) 176/104   Pulse (!) 101   Temp 98 F (36.7 C) (Oral)   Resp 17   Ht 5' 2.5" (1.588 m)   Wt 94.8 kg   LMP 11/01/2019 (Exact Date)   SpO2 99%   BMI 37.62 kg/m  EFM: baseline 120/moderate variability/+accels, no decels  CVE: Dilation: 6 Effacement (%): 80, 90 Station: -1 Presentation: Vertex Exam by:: Dr. Myriam Jacobson    [name]: 29 y.o. G1P0 [redacted]w[redacted]d for IOL for preE with severe-range BP's  #Induction of Labor:  S/p FB and cytotec x2. Pitocin started at 2253 on 8/20, currently running at 26. AROM at 1150. IUPC placed @1600 . Initially had been adequate but have not been for past few hours. Plan to washout Pitocin and restart in two hours given inadequate contraction pattern on near-max Pitocin.   #preE w SF: diagnosed based by SR pressures. PEC labs stable, last checked 8/20. BP persistently in 150/80's, will started labetalol 200mg  PO BID, IV hypertensives prn.   #hx of anxiety: cont pta lexapro. May have her PTA ativan daily prn.  #Pain: Epidural #FWB: Cat I  #GBS  unknown, on PCN   9/20, MD 9:46 PM

## 2020-07-10 NOTE — Anesthesia Preprocedure Evaluation (Signed)
Anesthesia Evaluation  Patient identified by MRN, date of birth, ID band Patient awake    Reviewed: Allergy & Precautions, H&P , NPO status , Patient's Chart, lab work & pertinent test results  Airway Mallampati: II  TM Distance: >3 FB Neck ROM: full    Dental no notable dental hx. (+) Teeth Intact   Pulmonary former smoker,    Pulmonary exam normal breath sounds clear to auscultation       Cardiovascular hypertension, Pt. on medications Normal cardiovascular exam Rhythm:regular Rate:Normal     Neuro/Psych PSYCHIATRIC DISORDERS Anxiety negative neurological ROS     GI/Hepatic Neg liver ROS,   Endo/Other  negative endocrine ROS  Renal/GU negative Renal ROS  negative genitourinary   Musculoskeletal   Abdominal (+) + obese,   Peds  Hematology  (+) Blood dyscrasia, anemia ,   Anesthesia Other Findings   Reproductive/Obstetrics (+) Pregnancy                             Anesthesia Physical Anesthesia Plan  ASA: II  Anesthesia Plan: Epidural   Post-op Pain Management:    Induction:   PONV Risk Score and Plan:   Airway Management Planned:   Additional Equipment:   Intra-op Plan:   Post-operative Plan:   Informed Consent: I have reviewed the patients History and Physical, chart, labs and discussed the procedure including the risks, benefits and alternatives for the proposed anesthesia with the patient or authorized representative who has indicated his/her understanding and acceptance.       Plan Discussed with:   Anesthesia Plan Comments:         Anesthesia Quick Evaluation

## 2020-07-10 NOTE — Progress Notes (Addendum)
Labor Progress Note- Late Entry Hannah Campos is a 29 y.o. G1P0 at [redacted]w[redacted]d presented for IOL for preE with SF based on severe-range BP's S: Continues to feel contractions. Notes some intermittent vision changes, feels like her eyes are "lazy" at times.   O:  BP (!) 152/88   Pulse 98   Temp 97.8 F (36.6 C) (Oral)   Resp 16   Ht 5' 2.5" (1.588 m)   Wt 94.8 kg   LMP 11/01/2019 (Exact Date)   SpO2 99%   BMI 37.62 kg/m  EFM: baseline 120/moderate variability/+ accels, no decels  CVE: Dilation: 5 Effacement (%): 80 Station: 0 Presentation: Vertex Exam by:: Dr. Barb Merino   [name]: 29 y.o. G1P0 [redacted]w[redacted]d IOL for PreE with SF based on severe-range BP's #Labor: AROM at 1150 with mostly clear fluid, small amount of blood-tinge. Plan to recheck around 1600 to assess progress. Pitocin at 69mL/hr #Pain: per patients request #FWB: Cat I #GBS  unknown. On PCN #preE with SF: on Mg. BP's elevated 152/88 most recently. Labetalol PRN for systolics greater than or equal to 160 and diastolics greater than or equal to 110. +DTR to L4 b/l. No clonus appreciated on exam.  Sabino Dick, DO 1:17 PM  Attestation of Supervision of Student:  I confirm that I have verified the information documented in the  resident's note and that I have also personally reperformed the history, physical exam and all medical decision making activities.  I have verified that all services and findings are accurately documented in this student's note; and I agree with management and plan as outlined in the documentation. I have also made any necessary editorial changes.  Sheila Oats, MD Center for [facility], [facility] 07/10/2020 3:14 PM

## 2020-07-10 NOTE — Anesthesia Procedure Notes (Signed)
Epidural Patient location during procedure: OB Start time: 07/10/2020 3:32 PM End time: 07/10/2020 3:44 PM  Staffing Anesthesiologist: Leilani Able, MD Performed: anesthesiologist   Preanesthetic Checklist Completed: patient identified, IV checked, site marked, risks and benefits discussed, surgical consent, monitors and equipment checked, pre-op evaluation and timeout performed  Epidural Patient position: sitting Prep: DuraPrep and site prepped and draped Patient monitoring: continuous pulse ox and blood pressure Approach: midline Location: L3-L4 Injection technique: LOR air  Needle:  Needle type: Tuohy  Needle gauge: 17 G Needle length: 9 cm and 9 Needle insertion depth: 6 cm Catheter type: closed end flexible Catheter size: 19 Gauge Catheter at skin depth: 11 cm Test dose: negative and Other  Assessment Events: blood not aspirated, injection not painful, no injection resistance, no paresthesia and negative IV test  Additional Notes Reason for block:procedure for pain

## 2020-07-10 NOTE — Progress Notes (Addendum)
Labor Progress Note Hannah Campos is a 29 y.o. G1P0 at [redacted]w[redacted]d presented for IOL for preE with SF based on severe range blood pressures S: IUPC placed  O:  BP (!) 144/89   Pulse 92   Temp 97.8 F (36.6 C) (Oral)   Resp 18   Ht 5' 2.5" (1.588 m)   Wt 94.8 kg   LMP 11/01/2019 (Exact Date)   SpO2 96%   BMI 37.62 kg/m  EFM: baseline 115/moderate variability/+accels, no decels  CVE: Dilation: 6 Effacement (%): 70 Station: 0 Presentation: Vertex Exam by:: Dr. Barb Merino   [name]: 29 y.o. G1P0 [redacted]w[redacted]d for IOL for preE with severe-range BP's #Labor: Progressing well. IUPC placed @1600 . MVUs 220. Pitocin at 80mL/hr. Plan for cervical check q2h now that she is in active labor #Pain: Epidural #FWB: Cat I #GBS  unknown, on PCN #preE: on Mg. BP's in 140's-150's systolic, most recent 144/89.   26m, DO 5:19 PM  Attestation of Supervision of Student:  I confirm that I have verified the information documented in the  resident's  note and that I have also personally reperformed the history, physical exam and all medical decision making activities.  I have verified that all services and findings are accurately documented in this student's note; and I agree with management and plan as outlined in the documentation. I have also made any necessary editorial changes.  Sabino Dick, MD Center for [facility], [facility] 07/10/2020 8:57 PM

## 2020-07-10 NOTE — Progress Notes (Signed)
Labor Progress Note Nalleli Largent is a 29 y.o. G1P0 at [redacted]w[redacted]d presented for IOL for preE with SF based on BP.  S: Felt anxious and began to have a panic attack around 0700, improved after Ativan administration. Now resting calmly, feeling some contractions.  O:  BP 133/88   Pulse (!) 104   Temp 97.7 F (36.5 C) (Oral)   Resp 16   Ht 5' 2.5" (1.588 m)   Wt 94.8 kg   LMP 11/01/2019 (Exact Date)   SpO2 99%   BMI 37.62 kg/m  EFM: 115/minimal variability/+ accels/no decels  CVE: Dilation: 5.5 Effacement (%): 90 Station: -1 Presentation: Vertex Exam by:: Dr. Maryagnes Amos   [name]: 29 y.o. G1P0 [redacted]w[redacted]d presenting for IOL for preE with SF based on BP.  #Labor: Progressing well, making cervical change. Pitocin at 18 mL/hr. Bulging bag on exam, discussed AROM with patient and will consider at next check. Continue to increase dose of pitocin per protocol.  #Pre-eclampsia with severe features: S/p two doses of BMZ, Mg bolus. Had several mild pressures 140s-150s/90s between 0700 and 0800 during panic attack, now back down to normal range.  #Anxiety: Home lexapro 10mg  qHS. 1mg  Ativan PRN panic attacks, received one dose for panic attack at 0652. S/p Benadryl 12.5mg  to sleep.  #Anemia: PO iron supplementation during pregnancy. Most recent Hgb 11.1 on 8/19.   #Pain: Per patient request, will desire epidural #FWB: Cat II, minimal variability but occurring in the setting of receiving magnesium, reassuring for presence of accels and lack of decels #GBSunknown, receiving penicillin(preterm)    , MD 9:20 AM

## 2020-07-11 ENCOUNTER — Encounter (HOSPITAL_COMMUNITY): Payer: Self-pay | Admitting: Family Medicine

## 2020-07-11 DIAGNOSIS — Z3A37 37 weeks gestation of pregnancy: Secondary | ICD-10-CM

## 2020-07-11 DIAGNOSIS — Z3A36 36 weeks gestation of pregnancy: Secondary | ICD-10-CM

## 2020-07-11 DIAGNOSIS — O1413 Severe pre-eclampsia, third trimester: Secondary | ICD-10-CM

## 2020-07-11 LAB — COMPREHENSIVE METABOLIC PANEL
ALT: 21 U/L (ref 0–44)
AST: 27 U/L (ref 15–41)
Albumin: 2.4 g/dL — ABNORMAL LOW (ref 3.5–5.0)
Alkaline Phosphatase: 123 U/L (ref 38–126)
Anion gap: 11 (ref 5–15)
BUN: 5 mg/dL — ABNORMAL LOW (ref 6–20)
CO2: 22 mmol/L (ref 22–32)
Calcium: 6.6 mg/dL — ABNORMAL LOW (ref 8.9–10.3)
Chloride: 102 mmol/L (ref 98–111)
Creatinine, Ser: 0.8 mg/dL (ref 0.44–1.00)
GFR calc Af Amer: >60 mL/min (ref >60)
GFR calc non Af Amer: >60 mL/min (ref >60)
Glucose, Bld: 117 mg/dL — ABNORMAL HIGH (ref 70–99)
Potassium: 3.4 mmol/L — ABNORMAL LOW (ref 3.5–5.1)
Sodium: 135 mmol/L (ref 135–145)
Total Bilirubin: 0.5 mg/dL (ref 0.3–1.2)
Total Protein: 5.8 g/dL — ABNORMAL LOW (ref 6.5–8.1)

## 2020-07-11 LAB — CBC
HCT: 32 % — ABNORMAL LOW (ref 36.0–46.0)
Hemoglobin: 9.8 g/dL — ABNORMAL LOW (ref 12.0–15.0)
MCH: 27.2 pg (ref 26.0–34.0)
MCHC: 30.6 g/dL (ref 30.0–36.0)
MCV: 88.9 fL (ref 80.0–100.0)
Platelets: 263 10*3/uL (ref 150–400)
RBC: 3.6 MIL/uL — ABNORMAL LOW (ref 3.87–5.11)
RDW: 21.1 % — ABNORMAL HIGH (ref 11.5–15.5)
WBC: 13.7 10*3/uL — ABNORMAL HIGH (ref 4.0–10.5)
nRBC: 0 % (ref 0.0–0.2)

## 2020-07-11 LAB — PROTIME-INR
INR: 1.1 (ref 0.8–1.2)
Prothrombin Time: 13.7 seconds (ref 11.4–15.2)

## 2020-07-11 LAB — MAGNESIUM: Magnesium: 7 mg/dL (ref 1.7–2.4)

## 2020-07-11 LAB — APTT: aPTT: 26 seconds (ref 24–36)

## 2020-07-11 MED ORDER — PRENATAL MULTIVITAMIN CH
1.0000 | ORAL_TABLET | Freq: Every day | ORAL | Status: DC
  Administered 2020-07-12 – 2020-07-14 (×3): 1 via ORAL
  Filled 2020-07-11 (×3): qty 1

## 2020-07-11 MED ORDER — DIBUCAINE (PERIANAL) 1 % EX OINT: 1.0000 "application " | TOPICAL_OINTMENT | CUTANEOUS | Status: DC | PRN

## 2020-07-11 MED ORDER — ZOLPIDEM TARTRATE 5 MG PO TABS: 5.0000 mg | ORAL_TABLET | Freq: Every evening | ORAL | Status: DC | PRN

## 2020-07-11 MED ORDER — MAGNESIUM SULFATE 40 GM/1000ML IV SOLN
2.0000 g/h | INTRAVENOUS | Status: AC
  Administered 2020-07-11 – 2020-07-12 (×2): 2 g/h via INTRAVENOUS
  Filled 2020-07-11: qty 1000

## 2020-07-11 MED ORDER — FERROUS SULFATE 325 (65 FE) MG PO TABS
325.0000 mg | ORAL_TABLET | Freq: Two times a day (BID) | ORAL | Status: DC
  Administered 2020-07-12: 325 mg via ORAL
  Filled 2020-07-11: qty 1

## 2020-07-11 MED ORDER — HYDRALAZINE HCL 20 MG/ML IJ SOLN: 10.0000 mg | INTRAMUSCULAR | Status: DC | PRN

## 2020-07-11 MED ORDER — TRANEXAMIC ACID-NACL 1000-0.7 MG/100ML-% IV SOLN: 1000.0000 mg | INTRAVENOUS | Status: AC

## 2020-07-11 MED ORDER — SIMETHICONE 80 MG PO CHEW
80.0000 mg | CHEWABLE_TABLET | ORAL | Status: DC | PRN
  Administered 2020-07-11: 80 mg via ORAL
  Filled 2020-07-11: qty 1

## 2020-07-11 MED ORDER — LABETALOL HCL 5 MG/ML IV SOLN: 40.0000 mg | INTRAVENOUS | Status: DC | PRN

## 2020-07-11 MED ORDER — NIFEDIPINE ER OSMOTIC RELEASE 30 MG PO TB24
30.0000 mg | ORAL_TABLET | Freq: Every day | ORAL | Status: DC
  Administered 2020-07-11 – 2020-07-12 (×2): 30 mg via ORAL
  Filled 2020-07-11 (×2): qty 1

## 2020-07-11 MED ORDER — TETANUS-DIPHTH-ACELL PERTUSSIS 5-2.5-18.5 LF-MCG/0.5 IM SUSP: 0.5000 mL | Freq: Once | INTRAMUSCULAR | Status: DC

## 2020-07-11 MED ORDER — ONDANSETRON HCL 4 MG PO TABS
4.0000 mg | ORAL_TABLET | ORAL | Status: DC | PRN
  Administered 2020-07-12: 4 mg via ORAL
  Filled 2020-07-11: qty 1

## 2020-07-11 MED ORDER — TRANEXAMIC ACID-NACL 1000-0.7 MG/100ML-% IV SOLN
INTRAVENOUS | Status: AC
  Administered 2020-07-11: 1000 mg via INTRAVENOUS
  Filled 2020-07-11: qty 100

## 2020-07-11 MED ORDER — MISOPROSTOL 200 MCG PO TABS
ORAL_TABLET | ORAL | Status: AC
  Filled 2020-07-11: qty 1

## 2020-07-11 MED ORDER — IBUPROFEN 600 MG PO TABS
600.0000 mg | ORAL_TABLET | Freq: Four times a day (QID) | ORAL | Status: DC
  Administered 2020-07-11 – 2020-07-14 (×10): 600 mg via ORAL
  Filled 2020-07-11 (×11): qty 1

## 2020-07-11 MED ORDER — MISOPROSTOL 200 MCG PO TABS: 800.0000 ug | ORAL_TABLET | Freq: Once | ORAL | Status: AC

## 2020-07-11 MED ORDER — LABETALOL HCL 5 MG/ML IV SOLN: 80.0000 mg | INTRAVENOUS | Status: DC | PRN

## 2020-07-11 MED ORDER — COCONUT OIL OIL: 1.0000 "application " | TOPICAL_OIL | Status: DC | PRN

## 2020-07-11 MED ORDER — LABETALOL HCL 5 MG/ML IV SOLN: 20.0000 mg | INTRAVENOUS | Status: DC | PRN

## 2020-07-11 MED ORDER — LORAZEPAM 0.5 MG PO TABS: 1.0000 mg | ORAL_TABLET | Freq: Once | ORAL | Status: DC | PRN

## 2020-07-11 MED ORDER — ONDANSETRON HCL 4 MG/2ML IJ SOLN: 4.0000 mg | INTRAMUSCULAR | Status: DC | PRN

## 2020-07-11 MED ORDER — LACTATED RINGERS IV SOLN: INTRAVENOUS | Status: DC

## 2020-07-11 MED ORDER — DIPHENHYDRAMINE HCL 25 MG PO CAPS: 25.0000 mg | ORAL_CAPSULE | Freq: Four times a day (QID) | ORAL | Status: DC | PRN

## 2020-07-11 MED ORDER — ACETAMINOPHEN 325 MG PO TABS: 650.0000 mg | ORAL_TABLET | ORAL | Status: DC | PRN

## 2020-07-11 MED ORDER — OXYCODONE-ACETAMINOPHEN 5-325 MG PO TABS: 2.0000 | ORAL_TABLET | ORAL | Status: DC | PRN

## 2020-07-11 MED ORDER — BENZOCAINE-MENTHOL 20-0.5 % EX AERO
1.0000 "application " | INHALATION_SPRAY | CUTANEOUS | Status: DC | PRN
  Administered 2020-07-11: 1 via TOPICAL
  Filled 2020-07-11: qty 56

## 2020-07-11 MED ORDER — OXYCODONE-ACETAMINOPHEN 5-325 MG PO TABS: 1.0000 | ORAL_TABLET | ORAL | Status: DC | PRN

## 2020-07-11 MED ORDER — MAGNESIUM HYDROXIDE 400 MG/5ML PO SUSP
30.0000 mL | ORAL | Status: DC | PRN
  Administered 2020-07-12: 30 mL via ORAL
  Filled 2020-07-11: qty 30

## 2020-07-11 MED ORDER — MISOPROSTOL 200 MCG PO TABS
ORAL_TABLET | ORAL | Status: AC
  Administered 2020-07-11: 800 ug via ORAL
  Filled 2020-07-11: qty 4

## 2020-07-11 MED ORDER — WITCH HAZEL-GLYCERIN EX PADS: 1.0000 "application " | MEDICATED_PAD | CUTANEOUS | Status: DC | PRN

## 2020-07-11 MED ORDER — MEASLES, MUMPS & RUBELLA VAC IJ SOLR: 0.5000 mL | Freq: Once | INTRAMUSCULAR | Status: DC

## 2020-07-11 NOTE — Progress Notes (Signed)
Pt pushing in supported squat position.  Pt still has little feeling and finding it difficult to sustain push.  Little progress made.

## 2020-07-11 NOTE — Progress Notes (Signed)
First push with squat bar

## 2020-07-11 NOTE — Progress Notes (Signed)
Labor Progress Note Shadi Larner is a 29 y.o. G1P0 at [redacted]w[redacted]d presented for IOL for preE with SF based on severe range blood pressures.  S: Doing well overall, sleeping upon entry to room.  O:  BP (!) 114/55   Pulse 83   Temp 97.6 F (36.4 C) (Oral)   Resp 18   Ht 5' 2.5" (1.588 m)   Wt 94.8 kg   LMP 11/01/2019 (Exact Date)   SpO2 99%   BMI 37.62 kg/m  EFM: 115/minimal/no accels/occasional early and late decels  CVE: Dilation: 7 Effacement (%): 90 Station: -1 Presentation: Vertex Exam by:: Dr Myriam Jacobson  Received labetalol 200 mg at 2143 for persistent pressures in 150s/80s, also received PRN dose of Ativan 1mg  for anxiety around the same time. At 0001 noted to be hypotensive to 102/59, remained hypotensive to 99/51 on recheck at 0030. Discontinued labetalol and started IV fluids. Concern for magnesium toxicity given hypotension with new early and late decels on FHT, will check discontinue magnesium drip, check Mg level, and repeat preE labs.   [name]: 29 y.o. G1P0 [redacted]w[redacted]d presenting for IOL for preE with SF based on severe range blood pressures.  #Induction of Labor: Progressing well, making cervical change. Pitocin stopped 2123 in an attempt at washout and restarted at 0100, now at 15 mL/hr . Will continue to increase pitocin as tolerated.  #Pre-eclampsia with severe features: Diagnosed based on severe range pressures. On Mg 2g/hr. PEC labs stable, last checked 8/20. Now hypotensive, see above. Mg and labetalol held and receiving IV fluids.  #Anxiety: Home lexapro 10mg  qHS. 1mg  Ativan PRN panic attacks, received one dose for panic attack at 0652.S/pBenadryl 12.5mg  to sleep.  #Anemia: PO iron supplementation during pregnancy. Most recent Hgb 11.1 on 8/19.  #Pain: Epidural #FWB: Cat II for presence of early and late decels with minimal variability in setting of magnesium adminstration, improved after fluids and discontinuation of Mg. #GBS unknown, on penicillin   , MD 12:52 AM

## 2020-07-11 NOTE — Progress Notes (Signed)
..  Labor Progress Note Taleigh Gero is a 29 y.o. G1P0 at [redacted]w[redacted]d presented for IOL secondary to preeclampsia with severe features   S: Complete and pushing in a variety of positions   O:  BP 136/80   Pulse 80   Temp (!) (P) 97.5 F (36.4 C) (Oral)   Resp 20   Ht 5' 2.5" (1.588 m)   Wt 94.8 kg   LMP 11/01/2019 (Exact Date)   SpO2 99%   BMI 37.62 kg/m  EFM:   CVE: Dilation: 10 Dilation Complete Date: 07/11/20 Dilation Complete Time: 1143 Effacement (%): 100 Station: Plus 2 Presentation: Vertex Exam by:: [name] CNM   [name]: 29 y.o. G1P0 [redacted]w[redacted]d  #Labor: Pushing for one hour with no progress  #Pain: epidural  #FWB: continuous fetal monitoring   Will take a rest now for a bit and then resume pushing efforts   [employer], Student-MidWife 1:58 PM

## 2020-07-11 NOTE — Progress Notes (Signed)
Patient ID: Vanellope Passmore, female   DOB: [birth year], 29 y.o.   MRN: [id number] Alverta Caccamo is a 29 y.o. G1P0 at [redacted]w[redacted]d.  Subjective: Feeling slight urge to push. Very anxious.   Objective: BP 123/71   Pulse 89   Temp 97.9 F (36.6 C) (Axillary)   Resp 20   Ht 5' 2.5" (1.588 m)   Wt 94.8 kg   LMP 11/01/2019 (Exact Date)   SpO2 99%   BMI 37.62 kg/m    FHT:  FHR: 120 bpm, variability: min-mod,  accelerations:  10x10,  decelerations:  none UC:   Q 3-5 minutes, MVUs 160-180 Dilation: 10 Dilation Complete Date: 07/11/20 Dilation Complete Time: 1143 Effacement (%): 100 Station: +2 Presentation: Vertex Exam by:: [employer]: Results for orders placed or performed during the hospital encounter of 07/08/20 (from the past 24 hour(s))  Comprehensive metabolic panel     Status: Abnormal   Collection Time: 07/11/20  1:40 AM  Result Value Ref Range   Sodium 135 135 - 145 mmol/L   Potassium 3.4 (L) 3.5 - 5.1 mmol/L   Chloride 102 98 - 111 mmol/L   CO2 22 22 - 32 mmol/L   Glucose, Bld 117 (H) 70 - 99 mg/dL   BUN 5 (L) 6 - 20 mg/dL   Creatinine, Ser 6.01 0.44 - 1.00 mg/dL   Calcium 6.6 (L) 8.9 - 10.3 mg/dL   Total Protein 5.8 (L) 6.5 - 8.1 g/dL   Albumin 2.4 (L) 3.5 - 5.0 g/dL   AST 27 15 - 41 U/L   ALT 21 0 - 44 U/L   Alkaline Phosphatase 123 38 - 126 U/L   Total Bilirubin 0.5 0.3 - 1.2 mg/dL   GFR calc non Af Amer >60 >60 mL/min   GFR calc Af Amer >60 >60 mL/min   Anion gap 11 5 - 15  CBC     Status: Abnormal   Collection Time: 07/11/20  1:40 AM  Result Value Ref Range   WBC 13.7 (H) 4.0 - 10.5 K/uL   RBC 3.60 (L) 3.87 - 5.11 MIL/uL   Hemoglobin 9.8 (L) 12.0 - 15.0 g/dL   HCT 09.3 (L) 36 - 46 %   MCV 88.9 80.0 - 100.0 fL   MCH 27.2 26.0 - 34.0 pg   MCHC 30.6 30.0 - 36.0 g/dL   RDW 23.5 (H) 57.3 - 22.0 %   Platelets 263 150 - 400 K/uL   nRBC 0.0 0.0 - 0.2 %  Protime-INR     Status: None   Collection Time: 07/11/20  1:40 AM  Result Value Ref Range    Prothrombin Time 13.7 11.4 - 15.2 seconds   INR 1.1 0.8 - 1.2  APTT     Status: None   Collection Time: 07/11/20  1:40 AM  Result Value Ref Range   aPTT 26 24 - 36 seconds  Magnesium     Status: Abnormal   Collection Time: 07/11/20  1:40 AM  Result Value Ref Range   Magnesium 7.0 (HH) 1.7 - 2.4 mg/dL    Assessment / Plan: [redacted]w[redacted]d week IUP Labor: Start 2nd stage. Pt tried practice pushing and was effective. Still not feeling strong urge. Was very anxious. Wants to wait 30 minutes. Asked about Ativan dose. CNM does not recommend this close to delivery due to additive sedating affects coupled with Mag Sulfate. Pt agreeable.   Fetal Wellbeing:  Category I Pain Control:  Epidural  Anticipated MOD:  SVD   Katrinka Blazing, [name], CNM  07/11/2020 12:43 PM

## 2020-07-11 NOTE — Progress Notes (Signed)
First push to assess progress.  Pt makes some movement but has little control and feeling.  Pt has anxiety about pushing, would like some time to process the next step.  Will re-evaluate pushing in 30 min - 1 hour.

## 2020-07-11 NOTE — Discharge Summary (Signed)
Postpartum Discharge Summary      Patient Name: Hannah Campos DOB: Mar 11, 1991 MRN: [id number]  Date of admission: 07/08/2020 Delivery date:07/11/2020  Delivering provider: Manya Silvas  Date of discharge: 07/14/2020  Admitting diagnosis: Preeclampsia [O14.90] Preeclampsia, third trimester [O14.93] Intrauterine pregnancy: [redacted]w[redacted]d    Secondary diagnosis:  Active Problems:   Preeclampsia   Preeclampsia, third trimester   Spontaneous vaginal delivery   Preterm delivery   [redacted] weeks gestation of pregnancy  Additional problems: n/a    Discharge diagnosis: Preterm Pregnancy Delivered, Preeclampsia (severe) and Anemia                                              Post partum procedures:n/a Augmentation: AROM, Pitocin, Cytotec and IP Foley Complications: None  Hospital course: Induction of Labor With Vaginal Delivery   29y.o. yo G1P0 at 353w1das admitted to the hospital 07/08/2020 for induction of labor.  Indication for induction: Preeclampsia.  Patient had an uncomplicated labor course as follows: Membrane Rupture Time/Date: 11:54 AM ,07/10/2020   Delivery Method:Vaginal, Spontaneous  Episiotomy: None  Lacerations:  1st degree;Vaginal  Details of delivery can be found in separate delivery note. Pt had 24 hrs magnesium therapy and was started on procardia. Her anti-hypertensives were titrated as needed and she was discharged home on PPD#3. Has BP check in 2 days. Also seen by SW for significant anxiety, cleared. Otherwise, patient had a routine postpartum course. Patient is discharged home 07/14/20.  Newborn Data: Birth date:07/11/2020  Birth time:3:50 PM  Gender:Female  Living status:Living  Apgars:7 ,8  [phone]   Magnesium Sulfate received: Yes: Seizure prophylaxis BMZ received: Yes Rhophylac:No MMR:No T-DaP:Given prenatally Flu: No Transfusion:No  Physical exam  Vitals:   07/13/20 2224 07/14/20 0224 07/14/20 0616 07/14/20 0832  BP:  (!) 149/90 (!) 142/74 (!)  150/90  Pulse:  90 82 97  Resp: '18 16 18 18  ' Temp: 98.2 F (36.8 C) 98.4 F (36.9 C) 98.3 F (36.8 C) 98.4 F (36.9 C)  TempSrc: Oral Oral Oral Oral  SpO2: 97% 97% 99% 99%  Weight:      Height:       General: alert, cooperative and no distress Lochia: appropriate Uterine Fundus: firm Incision: N/A DVT Evaluation: No evidence of DVT seen on physical exam. Labs: Lab Results  Component Value Date   WBC 12.1 (H) 07/12/2020   HGB 7.3 (L) 07/12/2020   HCT 23.3 (L) 07/12/2020   MCV 86.6 07/12/2020   PLT 251 07/12/2020   CMP Latest Ref Rng & Units 07/13/2020  Glucose 70 - 99 mg/dL 83  BUN 6 - 20 mg/dL 14  Creatinine 0.44 - 1.00 mg/dL 0.97  Sodium 135 - 145 mmol/L 134(L)  Potassium 3.5 - 5.1 mmol/L 3.8  Chloride 98 - 111 mmol/L 98  CO2 22 - 32 mmol/L 26  Calcium 8.9 - 10.3 mg/dL 8.3(L)  Total Protein 6.5 - 8.1 g/dL -  Total Bilirubin 0.3 - 1.2 mg/dL -  Alkaline Phos 38 - 126 U/L -  AST 15 - 41 U/L -  ALT 0 - 44 U/L -   Edinburgh Score: Edinburgh Postnatal Depression Scale Screening Tool 07/12/2020  I have been able to laugh and see the funny side of things. 0  I have looked forward with enjoyment to things. 0  I have blamed myself unnecessarily when things went  wrong. 1  I have been anxious or worried for no good reason. 2  I have felt scared or panicky for no good reason. 1  Things have been getting on top of me. 1  I have been so unhappy that I have had difficulty sleeping. 0  I have felt sad or miserable. 0  I have been so unhappy that I have been crying. 0  The thought of harming myself has occurred to me. 0  Edinburgh Postnatal Depression Scale Total 5     After visit meds:  Allergies as of 07/14/2020      Reactions   Adhesive [tape] Other (See Comments)   Swelling where it was applied      Medication List    TAKE these medications   acetaminophen 325 MG tablet Commonly known as: TYLENOL Take 325 mg by mouth every 6 (six) hours as needed for mild pain or  headache.   diphenhydrAMINE 25 mg capsule Commonly known as: BENADRYL Take 25 mg by mouth at bedtime.   escitalopram 10 MG tablet Commonly known as: LEXAPRO Take 10 mg by mouth daily.   ferrous sulfate 324 MG Tbec Take 324 mg by mouth daily with breakfast.   ibuprofen 600 MG tablet Commonly known as: ADVIL Take 1 tablet (600 mg total) by mouth every 6 (six) hours.   LORazepam 1 MG tablet Commonly known as: ATIVAN Take 1 mg by mouth as needed for anxiety.   NIFEdipine 60 MG 24 hr tablet Commonly known as: ADALAT CC Take 1 tablet (60 mg total) by mouth daily.   norethindrone 0.35 MG tablet Commonly known as: MICRONOR Take 1 tablet (0.35 mg total) by mouth daily.   oxyCODONE-acetaminophen 5-325 MG tablet Commonly known as: PERCOCET/ROXICET Take 1 tablet by mouth every 4 (four) hours as needed (pain scale 4-7).   PRENATAL VITAMINS PO Take 1 tablet by mouth daily.        Discharge home in stable condition Infant Feeding: Breast Infant Disposition:home with mother Discharge instruction: per After Visit Summary and Postpartum booklet. Activity: Advance as tolerated. Pelvic rest for 6 weeks.  Diet: iron rich diet Future Appointments: Future Appointments  Date Time Provider Blanket  07/16/2020  9:15 AM [facility] NURSE [facility] None  07/30/2020  9:45 AM Nehemiah Settle, Tanna Savoy, DO [facility] None   Follow up Visit:  [place]. Schedule an appointment as soon as possible for a visit in 1 week(s).   Specialty: Obstetrics and Gynecology Contact information: [address] [place] [id number] [phone]               Please schedule this patient for a In person postpartum visit in 4 weeks with the following provider: Any provider. Additional Postpartum F/U:Postpartum Depression checkup in 2 weeks and BP check 2-3 days (Started Procardia)  High risk pregnancy  complicated by: Pre-E Delivery mode:  Vaginal, Spontaneous  Anticipated Birth Control:  POPs   07/14/2020 Sloan Leiter, MD

## 2020-07-11 NOTE — Progress Notes (Signed)
Pt offered SVE to evaluate progress.  Pt declined at this time.

## 2020-07-11 NOTE — Progress Notes (Addendum)
Labor Progress Note Hannah Campos is a 29 y.o. G1P0 at [redacted]w[redacted]d presented for IOL for preE with SF based on severe range blood pressures  S: Feeling sleepy.   O:  BP 127/79   Pulse 72   Temp 97.8 F (36.6 C) (Axillary)   Resp 16   Ht 5' 2.5" (1.588 m)   Wt 94.8 kg   LMP 11/01/2019 (Exact Date)   SpO2 99%   BMI 37.62 kg/m  EFM: baseline 120/moderate to minimal variability/+accels, no decels   CVE: Dilation: 9 Effacement (%): 100 Station: -1 Presentation: Vertex Exam by:: Dr. Myriam Jacobson    [name]: 29 y.o. G1P0 [redacted]w[redacted]d for IOL for preE with severe-range BP's  #Induction of Labor:  S/p FB, cytotec x2, pitocin. AROM at 1150 on 8/21. IUPC placed @1600  on 8/21. Pit washed out overnight and restarted at 0000, now at 19. SVE 9/100/-1, plan for recheck in 2 hours.   #preE w SF: diagnosed based by SR pressures. PEC labs redrawn overnight and reviewed, stable.  BP has been 100s/50-60's since administration of labetalol last evening, will hold at this time. Magnesium paused at 0100 due to concerns for supra-therapeutic dosing, will restart now at 2g/hr.   #Anemia: PO iron supplementation during pregnancy. Most recentHgb 11.1 on8/19.   #hx of anxiety: cont pta lexapro.  #Pain: Epidural #FWB: Cat II due to minimal variability, however reassuring given occasional accels and no decels.  #GBS  unknown, on PCN   9/21, MD 6:10 AM

## 2020-07-11 NOTE — Progress Notes (Signed)
SVD baby boy, skin to skin with mother °

## 2020-07-12 LAB — TYPE AND SCREEN
ABO/RH(D): A NEG
Antibody Screen: NEGATIVE

## 2020-07-12 LAB — COMPREHENSIVE METABOLIC PANEL
ALT: 17 U/L (ref 0–44)
AST: 27 U/L (ref 15–41)
Albumin: 2 g/dL — ABNORMAL LOW (ref 3.5–5.0)
Alkaline Phosphatase: 90 U/L (ref 38–126)
Anion gap: 9 (ref 5–15)
BUN: 8 mg/dL (ref 6–20)
CO2: 24 mmol/L (ref 22–32)
Calcium: 6.4 mg/dL — CL (ref 8.9–10.3)
Chloride: 98 mmol/L (ref 98–111)
Creatinine, Ser: 1 mg/dL (ref 0.44–1.00)
GFR calc Af Amer: >60 mL/min (ref >60)
GFR calc non Af Amer: >60 mL/min (ref >60)
Glucose, Bld: 116 mg/dL — ABNORMAL HIGH (ref 70–99)
Potassium: 3.6 mmol/L (ref 3.5–5.1)
Sodium: 131 mmol/L — ABNORMAL LOW (ref 135–145)
Total Bilirubin: 0.5 mg/dL (ref 0.3–1.2)
Total Protein: 4.7 g/dL — ABNORMAL LOW (ref 6.5–8.1)

## 2020-07-12 LAB — CULTURE, BETA STREP (GROUP B ONLY): Strep Gp B Culture: NEGATIVE

## 2020-07-12 LAB — CBC
HCT: 23.3 % — ABNORMAL LOW (ref 36.0–46.0)
Hemoglobin: 7.3 g/dL — ABNORMAL LOW (ref 12.0–15.0)
MCH: 27.1 pg (ref 26.0–34.0)
MCHC: 31.3 g/dL (ref 30.0–36.0)
MCV: 86.6 fL (ref 80.0–100.0)
Platelets: 251 10*3/uL (ref 150–400)
RBC: 2.69 MIL/uL — ABNORMAL LOW (ref 3.87–5.11)
RDW: 20.1 % — ABNORMAL HIGH (ref 11.5–15.5)
WBC: 12.1 10*3/uL — ABNORMAL HIGH (ref 4.0–10.5)
nRBC: 0 % (ref 0.0–0.2)

## 2020-07-12 LAB — RPR: RPR Ser Ql: NONREACTIVE — AB

## 2020-07-12 MED ORDER — CALCIUM CARBONATE ANTACID 500 MG PO CHEW
2.0000 | CHEWABLE_TABLET | Freq: Three times a day (TID) | ORAL | Status: AC
  Administered 2020-07-12 – 2020-07-13 (×6): 400 mg via ORAL
  Filled 2020-07-12 (×6): qty 2

## 2020-07-12 MED ORDER — FERROUS SULFATE 325 (65 FE) MG PO TABS
325.0000 mg | ORAL_TABLET | ORAL | Status: DC
  Administered 2020-07-14: 325 mg via ORAL
  Filled 2020-07-12: qty 1

## 2020-07-12 MED ORDER — SODIUM CHLORIDE 0.9 % IV SOLN
510.0000 mg | Freq: Once | INTRAVENOUS | Status: AC
  Administered 2020-07-12: 510 mg via INTRAVENOUS
  Filled 2020-07-12: qty 17

## 2020-07-12 NOTE — Progress Notes (Signed)
Nurse tech attempted to ambulate patient around 0630. While obtaining orthostatic vital signs, patient became hypotensive and light headed while standing.   Nurse tech returned patient to bed at that moment.   Nurse instructed nurse tech to leave foley catheter in place, due to patients instability with ambulation.

## 2020-07-12 NOTE — Progress Notes (Signed)
CRITICAL VALUE ALERT  Critical Value:  Ca 6.4  Date & Time Notfied:  07/12/2020 6811  Provider Notified: Dr. Jolayne Panther   Orders Received/Actions taken: no new orders

## 2020-07-12 NOTE — Progress Notes (Signed)
Discharge instructions given to newborn's mother. All questions answered.

## 2020-07-12 NOTE — Progress Notes (Signed)
Faculty Attending Note  Post Partum Day 1  Subjective: Patient is feeling okay. She reports moderately well controlled pain on PO pain meds. She is ambulating and reports some light-headedness yesterday. She is not passing flatus. She is tolerating a egular diet without vomiting, does have some nausea. Bleeding is moderate. She is breast feeding. Baby is in room and doing well.  Objective: Blood pressure (!) 144/87, pulse 79, temperature 97.6 F (36.4 C), temperature source Oral, resp. rate 16, height 5' 2.5" (1.588 m), weight 94.8 kg, last menstrual period 11/01/2019, SpO2 96 %, unknown if currently breastfeeding. Temp:  [97.5 F (36.4 C)-98.7 F (37.1 C)] 97.6 F (36.4 C) (08/23 1141) Pulse Rate:  [77-117] 79 (08/23 1141) Resp:  [16-19] 16 (08/23 1141) BP: (109-160)/(61-110) 144/87 (08/23 1141) SpO2:  [95 %-98 %] 96 % (08/23 1141)  Physical Exam:  General: alert, oriented, cooperative Chest: normal respiratory effort Heart: RRR  Abdomen: soft, appropriately tender to palpation  Uterine Fundus: firm, 2 fingers below the umbilicus Lochia: moderate, rubra DVT Evaluation: no evidence of DVT Extremities: no edema, no calf tenderness  UOP: voiding spontaneously  Recent Labs    07/11/20 0140 07/12/20 0528  HGB 9.8* 7.3*  HCT 32.0* 23.3*    Assessment/Plan: Patient Active Problem List   Diagnosis Date Noted  . Spontaneous vaginal delivery 07/11/2020  . Preterm delivery 07/11/2020  . [redacted] weeks gestation of pregnancy 07/11/2020  . Preeclampsia 07/09/2020  . Preeclampsia, third trimester 07/09/2020  . Pre-eclampsia in third trimester 07/08/2020  . Anemia during pregnancy 06/15/2020  . Rh negative state in antepartum period 12/31/2019  . Pregnancy 12/30/2019  . History of repair of congenital cleft palate 12/30/2019  . GAD (generalized anxiety disorder) 10/30/2019    Patient is 29 y.o. G1P0101 PPD#1 s/p SVD at [redacted]w[redacted]d. IOL for pre-eclampsia with severe features. Course  complicated by anxiety, pre-eclampsia with severe features. She is doing well, reports some light-headedness and nausea but feeling better today than yesterday. Recovering appropriately.   Mag until 24 hours post partum (~4 pm today) IV iron dose today PO tums  Continue routine post partum care Pain meds prn Regular diet POPs for birth control No Rho gam - baby A neg Pt received COVID vaccination 04/2020 Plan for discharge possibly tomorrow    K. Therese Sarah, M.D. Attending Center for [employer] (Faculty Practice)  07/12/2020, 11:44 AM

## 2020-07-12 NOTE — Anesthesia Postprocedure Evaluation (Signed)
Anesthesia Post Note  Patient: Hannah Campos  Procedure(s) Performed: AN AD HOC LABOR EPIDURAL     Patient location during evaluation: Mother Baby Anesthesia Type: Epidural Level of consciousness: awake Pain management: satisfactory to patient Vital Signs Assessment: post-procedure vital signs reviewed and stable Respiratory status: spontaneous breathing Cardiovascular status: stable Anesthetic complications: no   No complications documented.  Last Vitals:  Vitals:   07/12/20 0507 07/12/20 0614  BP:    Pulse:    Resp: 16 17  Temp:    SpO2:      Last Pain:  Vitals:   07/12/20 0614  TempSrc:   PainSc: 0-No pain   Pain Goal: Patients Stated Pain Goal: 0 (07/09/20 0127)                 Cephus Shelling

## 2020-07-12 NOTE — Lactation Note (Signed)
This note was copied from a baby's chart. Lactation Consultation Note  Patient Name: Hannah Campos [id number] Date: 07/12/2020 Reason for consult: Follow-up assessment  Follow up visit with 22 hours old LPTI of a P1 mother. Mother getting ready to breastfeed baby upon arrival. Mother has ~5 mL of colostrum in a vial to supplement after breastfeeding.  Offered assistance with latch. Mother prefers modified cradle position to left breast. Mother has plentiful colostrum, easily expressed. After several attempts baby latched with difficulty. Noted baby suckling and identified some swallowing. Encouraged mother to provide good back support for neck extension and deeper latch. After ~7 minutes baby unlatched. Observed a slanted nipple and talked attempting a deeper latch. Discussed using a nipple shield to encourage baby to attain a deeper latch. Mother agreed and demonstrated how to correctly place it. Baby latched with ease and able to maintain a deep latch with frequent swallowing for ~10 minutes. Noted colostrum inside shield when baby unlatch. Talked about burping and demonstrated options and encouraged to provide neck support. Baby skin to skin and support person had expressed colostrum in a bottle for supplementation ~5 mL. Showed paced bottle feeding technique and shared benefits with mother. Encouraged mother to pump after feeding .   Encouraged to contact [facility] for support and questions. Name provided in board   Feeding Feeding Type: Breast Fed  LATCH Score Latch: Repeated attempts needed to sustain latch, nipple held in mouth throughout feeding, stimulation needed to elicit sucking reflex.  Audible Swallowing: Spontaneous and intermittent  Type of Nipple: Everted at rest and after stimulation  Comfort (Breast/Nipple): Soft / non-tender  Hold (Positioning): Assistance needed to correctly position infant at breast and maintain latch.  LATCH Score: 8  Interventions Interventions:  Breast feeding basics reviewed;Assisted with latch;Skin to skin;Breast massage;Hand express;Adjust position;Support pillows;DEBP  Lactation Tools Discussed/Used Pump Review: Milk Storage;Setup, frequency, and cleaning   Consult Status Consult Status: Follow-up Date: 07/13/20 Follow-up type: In-patient    Hannah Campos 07/12/2020, 2:14 PM

## 2020-07-12 NOTE — Lactation Note (Signed)
This note was copied from a baby's chart. Lactation Consultation Note  Patient Name: Boy Kayelee Herbig [id number] Date: 07/12/2020  P1, 10 hour LPTI born ( 36 w 2d) Before LC entered room RN Leotis Shames) informed LC mom and infant is asleep. Per RN, mom brought her personal DEBP from home and she will make sure mom starts pumping every 3 hours for 15 minutes on intial setting to help establish and maintain milk supply. Infant has been sleepy and attempts have been made to latch infant at breast  with and without 20 mm NS.  Infant is being supplemented with EBM that mom has hand express. Per RN, mom easily expresses 10 mls of colostrum.  Mom will be followed up with [facility] services in the morning. LC gave RN LPTI policy which she will discussed with mom when mom awakens.    Maternal Data    Feeding Feeding Type: Bottle Fed - Breast Milk  LATCH Score                   Interventions    Lactation Tools Discussed/Used     Consult Status      Danelle Earthly 07/12/2020, 2:22 AM

## 2020-07-13 LAB — BASIC METABOLIC PANEL
Anion gap: 10 (ref 5–15)
BUN: 14 mg/dL (ref 6–20)
CO2: 26 mmol/L (ref 22–32)
Calcium: 8.3 mg/dL — ABNORMAL LOW (ref 8.9–10.3)
Chloride: 98 mmol/L (ref 98–111)
Creatinine, Ser: 0.97 mg/dL (ref 0.44–1.00)
GFR calc Af Amer: >60 mL/min (ref >60)
GFR calc non Af Amer: >60 mL/min (ref >60)
Glucose, Bld: 83 mg/dL (ref 70–99)
Potassium: 3.8 mmol/L (ref 3.5–5.1)
Sodium: 134 mmol/L — ABNORMAL LOW (ref 135–145)

## 2020-07-13 MED ORDER — OXYCODONE-ACETAMINOPHEN 5-325 MG PO TABS
1.0000 | ORAL_TABLET | ORAL | 0 refills | Status: DC | PRN
Start: 2020-07-13 — End: 2020-08-12

## 2020-07-13 MED ORDER — NIFEDIPINE ER OSMOTIC RELEASE 30 MG PO TB24
60.0000 mg | ORAL_TABLET | Freq: Every day | ORAL | Status: DC
  Administered 2020-07-13 – 2020-07-14 (×2): 60 mg via ORAL
  Filled 2020-07-13 (×2): qty 2

## 2020-07-13 MED ORDER — IBUPROFEN 600 MG PO TABS
600.0000 mg | ORAL_TABLET | Freq: Four times a day (QID) | ORAL | 0 refills | Status: DC
Start: 2020-07-13 — End: 2020-08-12

## 2020-07-13 MED ORDER — NIFEDIPINE ER 60 MG PO TB24
60.0000 mg | ORAL_TABLET | Freq: Every day | ORAL | 1 refills | Status: DC
Start: 2020-07-14 — End: 2020-07-30

## 2020-07-13 MED FILL — NIFEdipine ER 60 MG TB24: 60 | 60 days supply | Qty: 60 | Fill #0

## 2020-07-13 MED FILL — OXYCODONE-APAP 5-325MG: 5-325 | 5 days supply | Qty: 30 | Fill #0

## 2020-07-13 MED FILL — IBUPROFEN 600 MG TABLET: 600 | 8 days supply | Qty: 30 | Fill #0

## 2020-07-13 NOTE — Progress Notes (Signed)
CSW received consult for hx of Anxiety.  CSW met with MOB to offer support and complete assessment.    When CSW arrived, MOB was eating her breakfast, infant was asleep in his bassinet, and FOB was resting on the couch.  CSW explained CSW's role and MOB gave CSW permission to complete assessment while FOB was present.  The couple appeared supportive of one another and was easy to engage.  MOB was polite, forthcoming, and was receptive to meeting with CSW.  CSW asked about MOB's MH hx and MOB openly shared a dx of generalized anxiety.  MOB reported that she meets consistently with her psychologist and psychiatrist with [facility]. MOB reported that she increased her [place] visits in order to be proactive about preparing for postpartum depression/anxiety.  CSW praised MOB for being proactive. MOB also shared she has an active Rx for Lexapro and Ativan that she has been compliant with.  CSW provided education regarding the baby blues period vs. perinatal mood disorders, discussed treatment and gave resources for mental health follow up if concerns arise.  CSW recommends self-evaluation during the postpartum time period using the New Mom Checklist from Postpartum Progress and encouraged MOB to contact a medical professional if symptoms are noted at any time.  MOB presented with insight and awareness and did not display any acute MH symptoms. MOB also reported having a good support team and feeling comfortable seeking help if help is needed. CSW assessed for safety and MOB denied SI and HI.  CSW identifies no further need for intervention and no barriers to discharge at this time.  Laurey Arrow, MSW, LCSW Clinical Social Work [phone]

## 2020-07-13 NOTE — Progress Notes (Signed)
POSTPARTUM PROGRESS NOTE  Post Partum Day 2  Subjective:  Hannah Campos is a 29 y.o. G1P0101 s/p vaginal delivery at [redacted]w[redacted]d.  She reports she is doing well. No acute events overnight. She denies any problems with ambulating, voiding or po intake. Denies nausea or vomiting.  Pain is well controlled.  Lochia is WNL.  The patient has no acute complaints today and is looking forward to going home.  Objective: Blood pressure 138/87, pulse 89, temperature 97.8 F (36.6 C), temperature source Oral, resp. rate 18, height 5' 2.5" (1.588 m), weight 94.8 kg, last menstrual period 11/01/2019, SpO2 98 %, unknown if currently breastfeeding.  Physical Exam:  General: alert, cooperative and no distress Chest: no respiratory distress Heart:regular rate, distal pulses intact Abdomen: soft, nontender,  Uterine Fundus: firm, appropriately tender DVT Evaluation: No calf swelling or tenderness Extremities: minimal edema Skin: warm, dry  Recent Labs    07/11/20 0140 07/12/20 0528  HGB 9.8* 7.3*  HCT 32.0* 23.3*    Cr.  0.97 Assessment/Plan: Hannah Campos is a 29 y.o. G1P0101 s/p vaginal delivery at [redacted]w[redacted]d   PPD#2 - Doing well  Routine postpartum care  Contraception: POP Feeding: breast Dispo: Plan for discharge pending social work consult.   LOS: 4 days   Mariel Aloe, MD Faculty attending 07/13/2020, 7:43 AM

## 2020-07-13 NOTE — Progress Notes (Signed)
Pt feeling well, states mood has been stable. She is nervous about her blood pressures. Also reports feeling very hot all the time.  BP (!) 156/89 (BP Location: Left Arm)   Pulse 91   Temp 98.5 F (36.9 C) (Oral)   Resp 18   Ht 5' 2.5" (1.588 m)   Wt 94.8 kg   LMP 11/01/2019 (Exact Date)   SpO2 98%   Breastfeeding Unknown   BMI 37.62 kg/m  Gen: alert, oriented  A/P: 29 yo G1P0101 PPD#2 s/p SVD @ [redacted]w[redacted]d for severe pre-eclampsia. S/p magnesium therapy. Increase in dose of procardia this am for worsening BP, will monitor overnight in hospital and plan for discharge home tomorrow. PT and spouse agreeable to this plan.  Baldemar Lenis, M.D. Attending Center for [employer] Midwife)

## 2020-07-14 MED ORDER — NORETHINDRONE 0.35 MG PO TABS: 1.0000 | ORAL_TABLET | Freq: Every day | ORAL | 11 refills | Status: DC

## 2020-07-14 MED FILL — NORETHINDRONE 0.35 MG TAB: 0.35 | 28 days supply | Qty: 28 | Fill #0

## 2020-07-14 NOTE — Lactation Note (Signed)
This note was copied from a baby's chart. Lactation Consultation Note  Patient Name: Hannah Campos [id number] Date: 07/14/2020 Reason for consult: Follow-up assessment;Primapara;1st time breastfeeding;Late-preterm 34-36.6wks  6962 - 0921 - I followed up with Hannah Campos. She states that she is breast feeding her son and pumping. She brought her Freemie pump with her.  Baby is now 41 hours old and has been weaned from phototherapy. Parents hope for d/c today.  I reviewed LPI guidelines and recommended the following plan:  Breast feed on demand 8-12 times a day. Limit feedings to about 20 minutes. Offer both breasts in a feeding if possible. Post pump about 15-20 minutes after each feeding (I reviewed DEBP settings and flange fit). Supplement while pumping (have support person) using recommended LPI amounts by baby's age.  Parents to follow up with [facility].  Hannah Campos is interested in follow up to work on breast feeding. Her milk has not transitioned. I provided reassurance and discussed effects of Pre/E/Mag on milk production.  I put in a referral for [facility]. I provided community breast feeding resources and recommended that they also call to follow up.  Maternal Data Does the patient have breastfeeding experience prior to this delivery?: No  Interventions Interventions: Breast feeding basics reviewed  Lactation Tools Discussed/Used Tools: Pump Breast pump type: Double-Electric Breast Pump Pump Review: Setup, frequency, and cleaning   Consult Status Consult Status: Follow-up Date: 07/15/20 Follow-up type: In-patient    Walker Shadow 07/14/2020, 9:46 AM

## 2020-07-14 NOTE — Discharge Instructions (Signed)
Postpartum Hypertension Postpartum hypertension is high blood pressure that remains higher than normal after childbirth. You may not realize that you have postpartum hypertension if your blood pressure is not being checked regularly. In most cases, postpartum hypertension will go away on its own, usually within a week of delivery. However, for some women, medical treatment is required to prevent serious complications, such as seizures or stroke. What are the causes? This condition may be caused by one or more of the following:  Hypertension that existed before pregnancy (chronic hypertension).  Hypertension that comes on as a result of pregnancy (gestational hypertension).  Hypertensive disorders during pregnancy (preeclampsia) or seizures in women who have high blood pressure during pregnancy (eclampsia).  A condition in which the liver, platelets, and red blood cells are damaged during pregnancy (HELLP syndrome).  A condition in which the thyroid produces too much hormones (hyperthyroidism).  Other rare problems of the nerves (neurological disorders) or blood disorders. In some cases, the cause may not be known. What increases the risk? The following factors may make you more likely to develop this condition:  Chronic hypertension. In some cases, this may not have been diagnosed before pregnancy.  Obesity.  Type 2 diabetes.  Kidney disease.  History of preeclampsia or eclampsia.  Other medical conditions that change the level of hormones in the body (hormonal imbalance). What are the signs or symptoms? As with all types of hypertension, postpartum hypertension may not have any symptoms. Depending on how high your blood pressure is, you may experience:  Headaches. These may be mild, moderate, or severe. They may also be steady, constant, or sudden in onset (thunderclap headache).  Changes in your ability to see (visual changes).  Dizziness.  Shortness of breath.  Swelling  of your hands, feet, lower legs, or face. In some cases, you may have swelling in more than one of these locations.  Heart palpitations or a racing heartbeat.  Difficulty breathing while lying down.  Decrease in the amount of urine that you pass. Other rare signs and symptoms may include:  Sweating more than usual. This lasts longer than a few days after delivery.  Chest pain.  Sudden dizziness when you get up from sitting or lying down.  Seizures.  Nausea or vomiting.  Abdominal pain. How is this diagnosed? This condition may be diagnosed based on the results of a physical exam, blood pressure measurements, and blood and urine tests. You may also have other tests, such as a CT scan or an MRI, to check for other problems of postpartum hypertension. How is this treated? If blood pressure is high enough to require treatment, your options may include:  Medicines to reduce blood pressure (antihypertensives). Tell your health care provider if you are breastfeeding or if you plan to breastfeed. There are many antihypertensive medicines that are safe to take while breastfeeding.  Stopping medicines that may be causing hypertension.  Treating medical conditions that are causing hypertension.  Treating the complications of hypertension, such as seizures, stroke, or kidney problems. Your health care provider will also continue to monitor your blood pressure closely until it is within a safe range for you. Follow these instructions at home:  Take over-the-counter and prescription medicines only as told by your health care provider.  Return to your normal activities as told by your health care provider. Ask your health care provider what activities are safe for you.  Do not use any products that contain nicotine or tobacco, such as cigarettes and e-cigarettes. If   you need help quitting, ask your health care provider.  Keep all follow-up visits as told by your health care provider. This  is important. Contact a health care provider if:  Your symptoms get worse.  You have new symptoms, such as: ? A headache that does not get better. ? Dizziness. ? Visual changes. Get help right away if:  You suddenly develop swelling in your hands, ankles, or face.  You have sudden, rapid weight gain.  You develop difficulty breathing, chest pain, racing heartbeat, or heart palpitations.  You develop severe pain in your abdomen.  You have any symptoms of a stroke. "BE FAST" is an easy way to remember the main warning signs of a stroke: ? B - Balance. Signs are dizziness, sudden trouble walking, or loss of balance. ? E - Eyes. Signs are trouble seeing or a sudden change in vision. ? F - Face. Signs are sudden weakness or numbness of the face, or the face or eyelid drooping on one side. ? A - Arms. Signs are weakness or numbness in an arm. This happens suddenly and usually on one side of the body. ? S - Speech. Signs are sudden trouble speaking, slurred speech, or trouble understanding what people say. ? T - Time. Time to call emergency services. Write down what time symptoms started.  You have other signs of a stroke, such as: ? A sudden, severe headache with no known cause. ? Nausea or vomiting. ? Seizure. These symptoms may represent a serious problem that is an emergency. Do not wait to see if the symptoms will go away. Get medical help right away. Call your local emergency services (911 in the [place] Do not drive yourself to the hospital. Summary  Postpartum hypertension is high blood pressure that remains higher than normal after childbirth.  In most cases, postpartum hypertension will go away on its own, usually within a week of delivery.  For some women, medical treatment is required to prevent serious complications, such as seizures or stroke. This information is not intended to replace advice given to you by your health care provider. Make sure you discuss any questions  you have with your health care provider. Document Revised: 12/13/2018 Document Reviewed: 08/27/2017 Elsevier Patient Education  2020 [employer] Preeclampsia and Eclampsia Preeclampsia is a serious condition that may develop during pregnancy. This condition causes high blood pressure and increased protein in your urine along with other symptoms, such as headaches and vision changes. These symptoms may develop as the condition gets worse. Preeclampsia may occur at 20 weeks of pregnancy or later. Diagnosing and treating preeclampsia early is very important. If not treated early, it can cause serious problems for you and your baby. One problem it can lead to is eclampsia. Eclampsia is a condition that causes muscle jerking or shaking (convulsions or seizures) and other serious problems for the mother. During pregnancy, delivering your baby may be the best treatment for preeclampsia or eclampsia. For most women, preeclampsia and eclampsia symptoms go away after giving birth. In rare cases, a woman may develop preeclampsia after giving birth (postpartum preeclampsia). This usually occurs within 48 hours after childbirth but may occur up to 6 weeks after giving birth. What are the causes? The cause of preeclampsia is not known. What increases the risk? The following risk factors make you more likely to develop preeclampsia:  Being pregnant for the first time.  Having had preeclampsia during a past pregnancy.  Having a family history of preeclampsia.  Having high   blood pressure.  Being pregnant with more than one baby.  Being 35 or older.  Being African-American.  Having kidney disease or diabetes.  Having medical conditions such as lupus or blood diseases.  Being very overweight (obese). What are the signs or symptoms? The most common symptoms are:  Severe headaches.  Vision problems, such as blurred or double vision.  Abdominal pain, especially upper abdominal pain. Other  symptoms that may develop as the condition gets worse include:  Sudden weight gain.  Sudden swelling of the hands, face, legs, and feet.  Severe nausea and vomiting.  Numbness in the face, arms, legs, and feet.  Dizziness.  Urinating less than usual.  Slurred speech.  Convulsions or seizures. How is this diagnosed? There are no screening tests for preeclampsia. Your health care provider will ask you about symptoms and check for signs of preeclampsia during your prenatal visits. You may also have tests that include:  Checking your blood pressure.  Urine tests to check for protein. Your health care provider will check for this at every prenatal visit.  Blood tests.  Monitoring your baby's heart rate.  Ultrasound. How is this treated? You and your health care provider will determine the treatment approach that is best for you. Treatment may include:  Having more frequent prenatal exams to check for signs of preeclampsia, if you have an increased risk for preeclampsia.  Medicine to lower your blood pressure.  Staying in the hospital, if your condition is severe. There, treatment will focus on controlling your blood pressure and the amount of fluids in your body (fluid retention).  Taking medicine (magnesium sulfate) to prevent seizures. This may be given as an injection or through an IV.  Taking a low-dose aspirin during your pregnancy.  Delivering your baby early. You may have your labor started with medicine (induced), or you may have a cesarean delivery. Follow these instructions at home: Eating and drinking   Drink enough fluid to keep your urine pale yellow.  Avoid caffeine. Lifestyle  Do not use any products that contain nicotine or tobacco, such as cigarettes and e-cigarettes. If you need help quitting, ask your health care provider.  Do not use alcohol or drugs.  Avoid stress as much as possible. Rest and get plenty of sleep. General instructions  Take  over-the-counter and prescription medicines only as told by your health care provider.  When lying down, lie on your left side. This keeps pressure off your major blood vessels.  When sitting or lying down, raise (elevate) your feet. Try putting some pillows underneath your lower legs.  Exercise regularly. Ask your health care provider what kinds of exercise are best for you.  Keep all follow-up and prenatal visits as told by your health care provider. This is important. How is this prevented? There is no known way of preventing preeclampsia or eclampsia from developing. However, to lower your risk of complications and detect problems early:  Get regular prenatal care. Your health care provider may be able to diagnose and treat the condition early.  Maintain a healthy weight. Ask your health care provider for help managing weight gain during pregnancy.  Work with your health care provider to manage any long-term (chronic) health conditions you have, such as diabetes or kidney problems.  You may have tests of your blood pressure and kidney function after giving birth.  Your health care provider may have you take low-dose aspirin during your next pregnancy. Contact a health care provider if:  You have   symptoms that your health care provider told you may require more treatment or monitoring, such as: ? Headaches. ? Nausea or vomiting. ? Abdominal pain. ? Dizziness. ? Light-headedness. Get help right away if:  You have severe: ? Abdominal pain. ? Headaches that do not get better. ? Dizziness. ? Vision problems. ? Confusion. ? Nausea or vomiting.  You have any of the following: ? A seizure. ? Sudden, rapid weight gain. ? Sudden swelling in your hands, ankles, or face. ? Trouble moving any part of your body. ? Numbness in any part of your body. ? Trouble speaking. ? Abnormal bleeding.  You faint. Summary  Preeclampsia is a serious condition that may develop during  pregnancy.  This condition causes high blood pressure and increased protein in your urine along with other symptoms, such as headaches and vision changes.  Diagnosing and treating preeclampsia early is very important. If not treated early, it can cause serious problems for you and your baby.  Get help right away if you have symptoms that your health care provider told you to watch for. This information is not intended to replace advice given to you by your health care provider. Make sure you discuss any questions you have with your health care provider. Document Revised: 07/09/2018 Document Reviewed: 06/12/2016 Elsevier Patient Education  2020 Elsevier Inc.  

## 2020-07-14 NOTE — Lactation Note (Signed)
This note was copied from a baby's chart. Lactation Consultation Note  Patient Name: Boy Adaleen Hulgan [id number] Date: 07/14/2020  Baby Boy Poirier now 4 hours old having phototheraphy.  Mom reports she had really good colostrum the first two days but now isn't getting anything when she pumps from her left breast.  Mom reports she thinks it is changing some because what she is getting with her right looks a little more white. Urged mom to add massage and hand expression with pumping.  Urged to pump anyway even if not getting anything.  Urged to offer the breast first.  Then massage/pump and hand express past breastfeedings.  Praised moms efforts.  Urged her to call lactation as needed.            Maternal Data    Feeding Feeding Type: Bottle Fed - Formula  LATCH Score                   Interventions    Lactation Tools Discussed/Used     Consult Status      Mazey Mantell Michaelle Copas 07/14/2020, 12:19 AM

## 2020-07-15 ENCOUNTER — Ambulatory Visit: Payer: BLUE CROSS/BLUE SHIELD

## 2020-07-16 ENCOUNTER — Other Ambulatory Visit: Payer: Self-pay

## 2020-07-16 ENCOUNTER — Ambulatory Visit (INDEPENDENT_AMBULATORY_CARE_PROVIDER_SITE_OTHER): Payer: BLUE CROSS/BLUE SHIELD

## 2020-07-16 NOTE — Progress Notes (Signed)
Chart reviewed - agree with CMA/RN documentation.  ° °

## 2020-07-16 NOTE — Progress Notes (Signed)
Patient is five days postpartum. Patient denies any seeing spots, dizziness or blurry vision. Patient states she has not had her Nifedipine 60 mg this morning- she takes it at 10am everyday. Patient has next postpartum visit in 2 weeks.  Armandina Stammer RN

## 2020-07-17 ENCOUNTER — Inpatient Hospital Stay (HOSPITAL_COMMUNITY): Payer: BLUE CROSS/BLUE SHIELD

## 2020-07-17 ENCOUNTER — Inpatient Hospital Stay (HOSPITAL_COMMUNITY)
Admission: AD | Admit: 2020-07-17 | Payer: BLUE CROSS/BLUE SHIELD | Source: Home / Self Care | Admitting: Family Medicine

## 2020-07-30 ENCOUNTER — Encounter: Payer: Self-pay | Admitting: Family Medicine

## 2020-07-30 ENCOUNTER — Ambulatory Visit (INDEPENDENT_AMBULATORY_CARE_PROVIDER_SITE_OTHER): Payer: BLUE CROSS/BLUE SHIELD | Admitting: Family Medicine

## 2020-07-30 ENCOUNTER — Other Ambulatory Visit: Payer: Self-pay

## 2020-07-30 DIAGNOSIS — O1493 Unspecified pre-eclampsia, third trimester: Secondary | ICD-10-CM

## 2020-07-30 NOTE — Progress Notes (Signed)
   Subjective:    Patient ID: Hannah Campos, female    DOB: 12/27/1990, 29 y.o.   MRN: [id number]  HPI Patient seen for mood check - depression screen negative. Currently sees a therapist and has increased her visits there. Overall, feels well.   Review of Systems    BP 98/70   Pulse 87   Ht 5\' 2"  (1.575 m)   Wt 190 lb (86.2 kg)   LMP 11/01/2019 (Exact Date)   Breastfeeding Yes   BMI 34.75 kg/m   Objective:   Physical Exam Vitals reviewed.  Constitutional:      Appearance: Normal appearance.  Cardiovascular:     Rate and Rhythm: Normal rate.  Pulmonary:     Effort: Pulmonary effort is normal.  Neurological:     General: No focal deficit present.     Mental Status: She is alert.  Psychiatric:        Mood and Affect: Mood normal.        Thought Content: Thought content normal.        Judgment: Judgment normal.        Assessment & Plan:  1. Postpartum exam F/u in 2 weeks for full PP exam  2. Preeclampsia, third trimester BP normal. Stop procardia.

## 2020-08-07 ENCOUNTER — Inpatient Hospital Stay (HOSPITAL_COMMUNITY): Admit: 2020-08-07 | Payer: Self-pay

## 2020-08-12 ENCOUNTER — Other Ambulatory Visit: Payer: Self-pay

## 2020-08-12 ENCOUNTER — Ambulatory Visit (INDEPENDENT_AMBULATORY_CARE_PROVIDER_SITE_OTHER): Payer: BLUE CROSS/BLUE SHIELD | Admitting: Family Medicine

## 2020-08-12 ENCOUNTER — Encounter: Payer: Self-pay | Admitting: Family Medicine

## 2020-08-12 DIAGNOSIS — M899 Disorder of bone, unspecified: Secondary | ICD-10-CM

## 2020-08-12 DIAGNOSIS — F411 Generalized anxiety disorder: Secondary | ICD-10-CM

## 2020-08-12 DIAGNOSIS — Z8759 Personal history of other complications of pregnancy, childbirth and the puerperium: Secondary | ICD-10-CM

## 2020-08-12 DIAGNOSIS — O1493 Unspecified pre-eclampsia, third trimester: Secondary | ICD-10-CM

## 2020-08-12 NOTE — Progress Notes (Signed)
Post Partum Visit Note  Hannah Campos is a 29 y.o. [id number] female who presents for a postpartum visit. She is 5 weeks postpartum following a normal spontaneous vaginal delivery.  I have fully reviewed the prenatal and intrapartum course. The delivery was at 36.1 gestational weeks.  Anesthesia: epidural. Postpartum course has been uneventful. Baby is doing well. Baby is feeding by both breast and bottle. Bleeding no bleeding. Bowel function is normal. Bladder function is normal. Patient is not sexually active. Contraception method is oral progesterone-only contraceptive. Postpartum depression screening: negative. Score 5  Has been having pubic bone pain. Sees chiropractor, which is helpful, but not resolved.  The pregnancy intention screening data noted above was reviewed. Potential methods of contraception were discussed. The patient elected to proceed with Oral Contraceptive.    Edinburgh Postnatal Depression Scale - 08/12/20 1104      Edinburgh Postnatal Depression Scale:  In the Past 7 Days   I have been able to laugh and see the funny side of things. 0    I have looked forward with enjoyment to things. 0    I have blamed myself unnecessarily when things went wrong. 1    I have been anxious or worried for no good reason. 2    I have felt scared or panicky for no good reason. 1    Things have been getting on top of me. 1    I have been so unhappy that I have had difficulty sleeping. 0    I have felt sad or miserable. 0    I have been so unhappy that I have been crying. 0    The thought of harming myself has occurred to me. 0    Edinburgh Postnatal Depression Scale Total 5            Review of Systems Pertinent items are noted in HPI.    Objective:  Blood pressure 110/85, pulse 73, height 5' 2.5" (1.588 m), weight 191 lb (86.6 kg), last menstrual period 11/01/2019, currently breastfeeding.  General:  alert, cooperative and no distress  Lungs: clear to auscultation  bilaterally  Heart:  regular rate and rhythm, S1, S2 normal, no murmur, click, rub or gallop  Abdomen: soft, non-tender; bowel sounds normal; no masses,  no organomegaly        Assessment:    Normal postpartum exam. H/o preeclampsia with severe features, GAD. Pap smear not done at today's visit.   Plan:   Essential components of care per ACOG recommendations:  1.  Mood and well being: Patient with negative depression screening today. Reviewed local resources for support. She has continued her   - Patient does not use tobacco. - hx of drug use? No    2. Infant care and feeding:  -Patient currently breastmilk feeding? Yes  -Social determinants of health (SDOH) reviewed in EPIC. No concerns  3. Sexuality, contraception and birth spacing - Patient does not want a pregnancy in the next year.   - Patient desired oral progesterone-only contraceptive today.   - Discussed birth spacing of 18 months  4. Sleep and fatigue -Encouraged family/partner/community support of 4 hrs of uninterrupted sleep to help with mood and fatigue  5. Physical Recovery  - Discussed patients delivery and complications - Patient has urinary incontinence? No  - Patient is safe to resume physical and sexual activity  6.  Health Maintenance  7. Pubic bone pain - refer to pelvic floor PT  Levie Heritage, DO [facility], [place]

## 2021-02-15 IMAGING — US US MFM OB DETAIL+14 WK
1 series · 13 of 28 positions shown · non-contrast
Comparison: none

[Series 1: us mfm ob detail+14 wk · 13 of 100 slices shown]
[im 4/100]
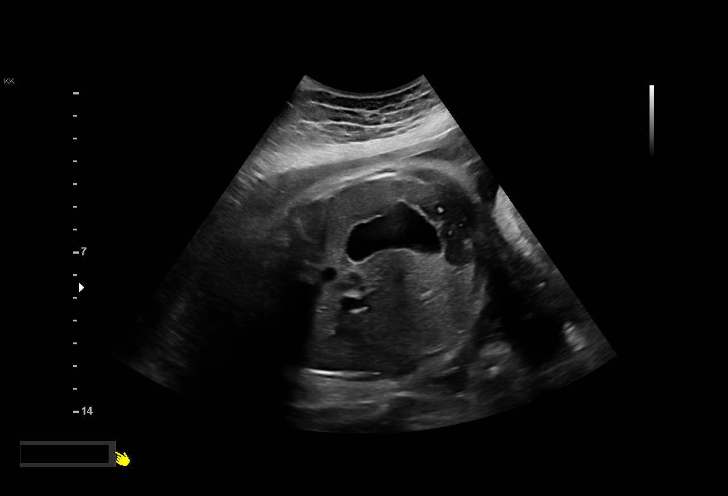
[im 12/100]
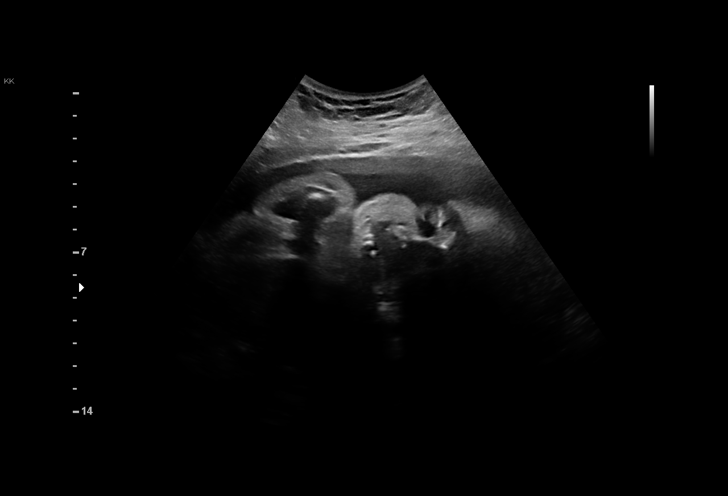
[im 19/100]
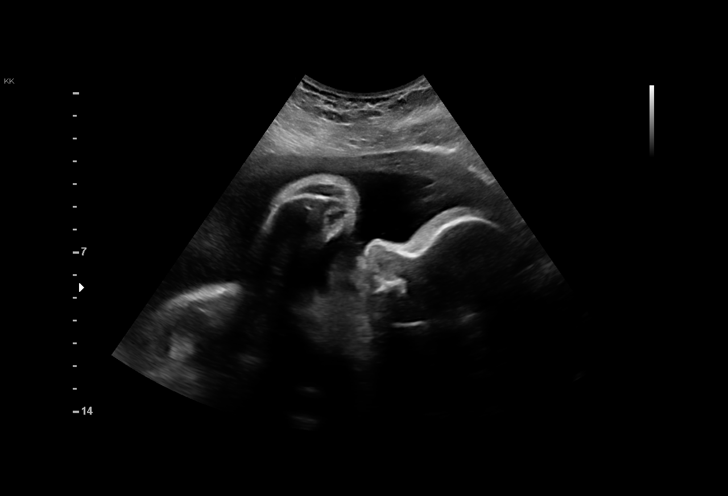
[im 26/100]
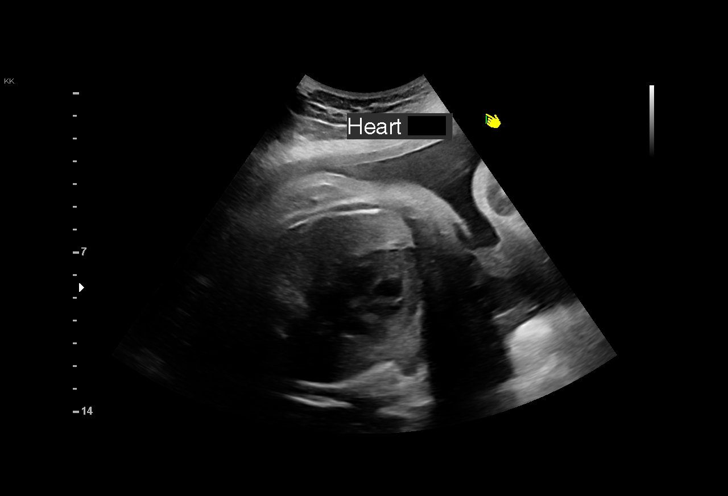
[im 34/100]
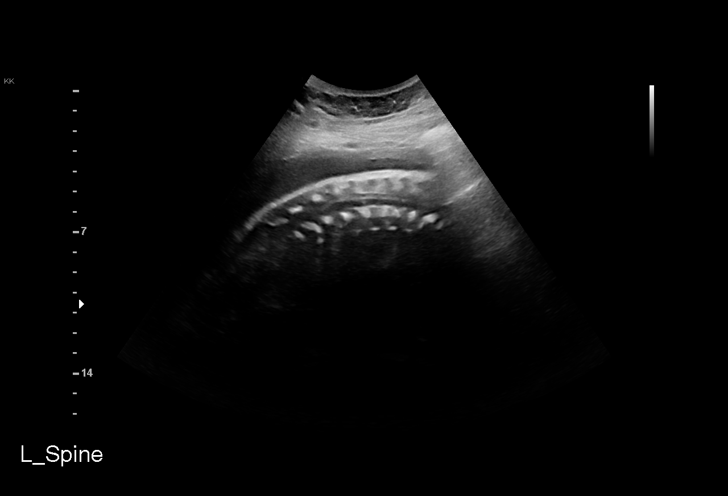
[im 41/100]
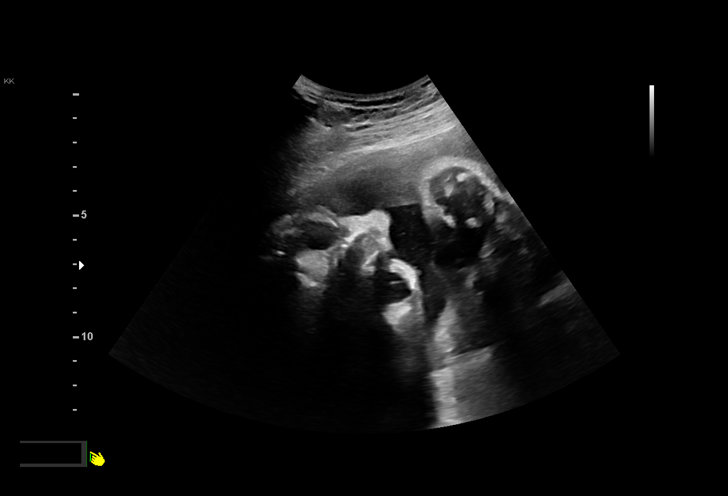
[im 52/100]
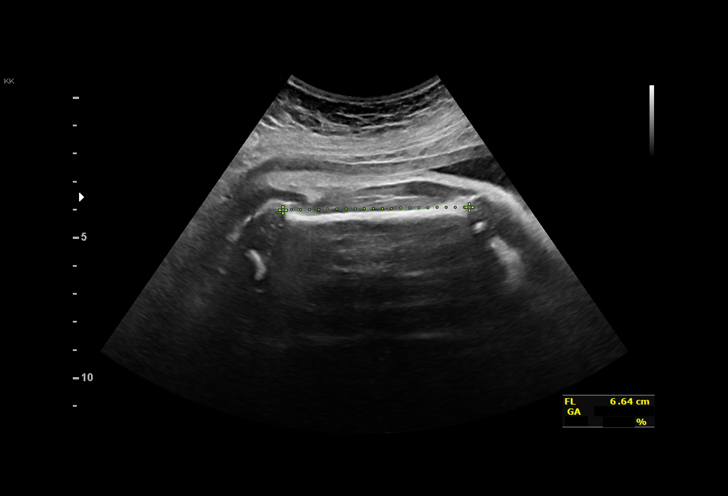
[im 59/100]
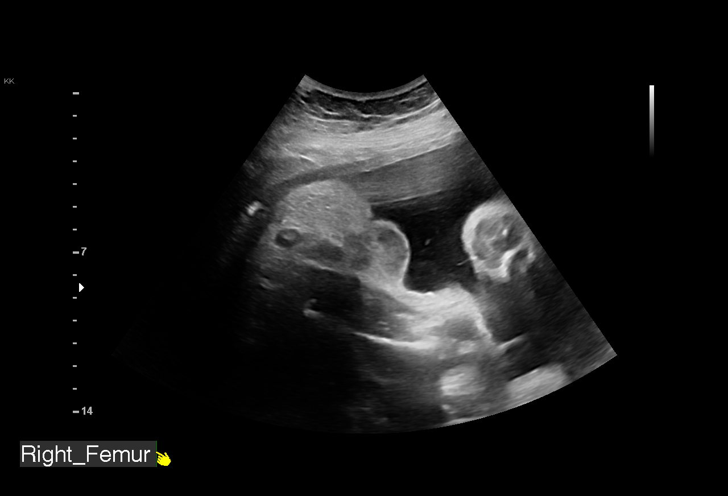
[im 67/100]
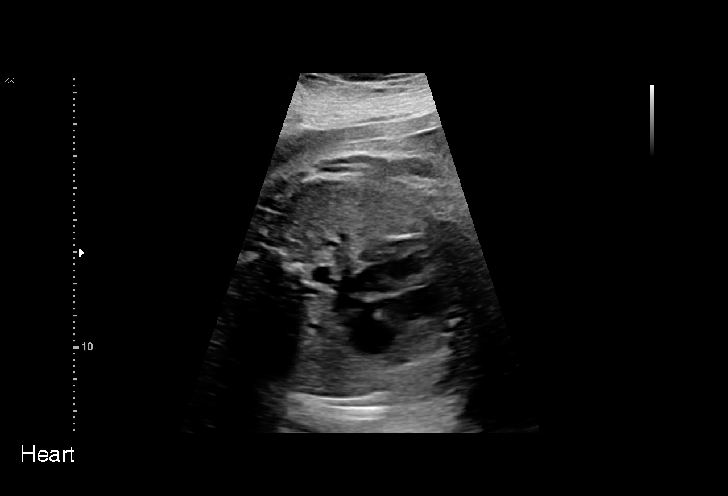
[im 74/100]
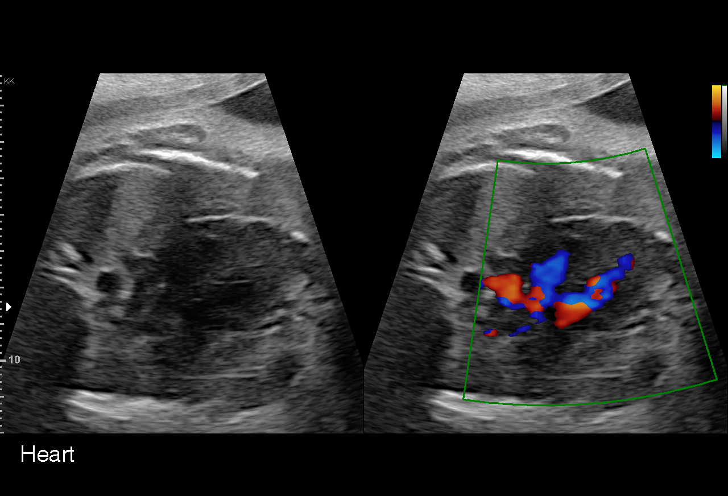
[im 81/100]
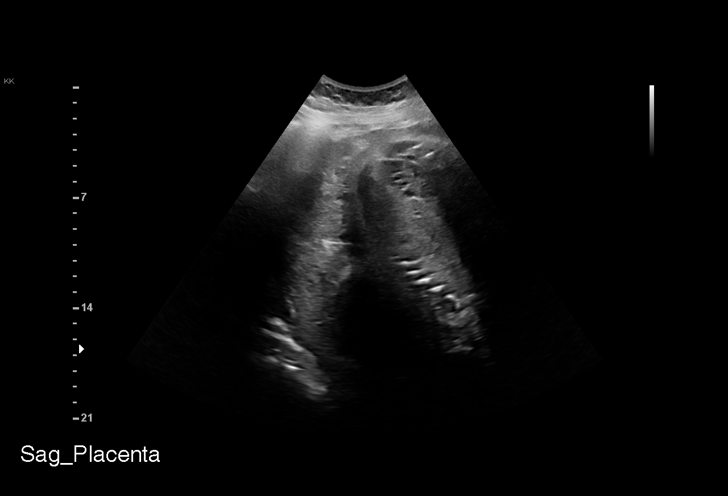
[im 89/100]
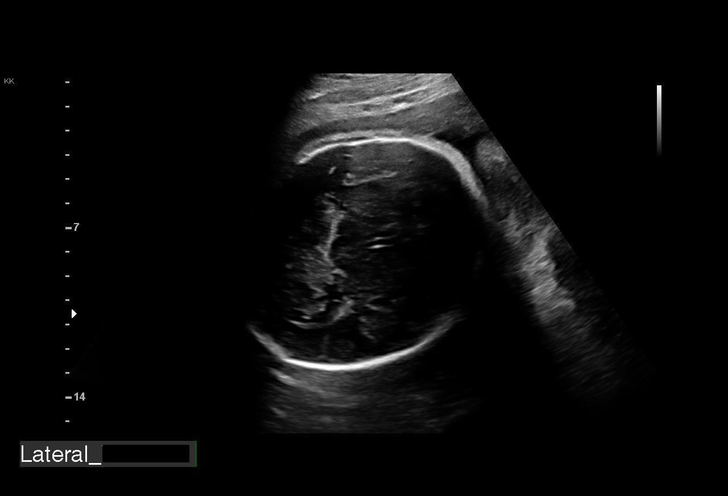
[im 96/100]
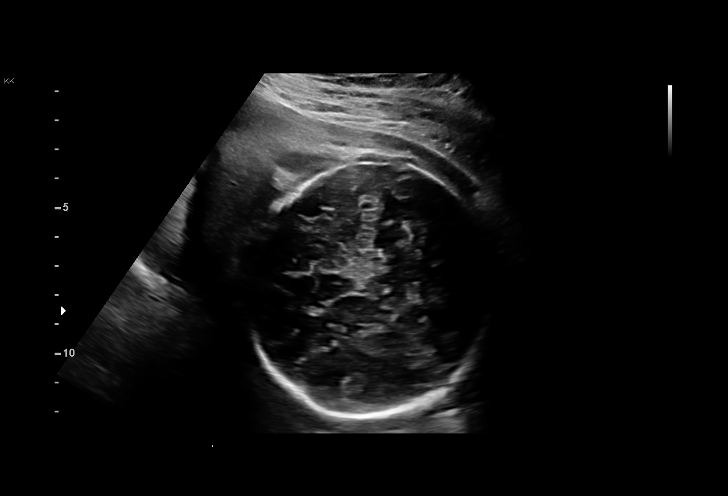

[13 of 28 positions shown; findings below may reference images not displayed]

[id number]

Indications

 Pre-eclampsia, third trimester
 35 weeks gestation of pregnancy
 Encounter for antenatal screening for
 malformations
 Obesity complicating pregnancy, third
 trimester
 Rh negative state in antepartum
 Anemia during pregnancy in third trimester
Vital Signs

 BMI:
Fetal Evaluation

 Num Of Fetuses:         1
 Fetal Heart Rate(bpm):  153
 Cardiac Activity:       Observed
 Presentation:           Cephalic
 Placenta:               Posterior Fundal
 P. Cord Insertion:      Not well visualized
 Amniotic Fluid
 AFI FV:      Within normal limits

 AFI Sum(cm)     %Tile       Largest Pocket(cm)
 16.45           61

 RUQ(cm)       RLQ(cm)       LUQ(cm)        LLQ(cm)

Biophysical Evaluation

 Amniotic F.V:   Pocket => 2 cm             F. Tone:        Observed
 F. Movement:    Observed                   Score:          [DATE]
 F. Breathing:   Observed
Biometry

 BPD:      90.4  mm     G. Age:  36w 4d         80  %    CI:        78.12   %    70 - 86
                                                         FL/HC:      20.6   %    20.1 -
 HC:      323.6  mm     G. Age:  36w 4d         39  %    HC/AC:      0.95        0.93 -
 AC:      341.8  mm     G. Age:  38w 1d         98  %    FL/BPD:     73.9   %    71 - 87
 FL:       66.8  mm     G. Age:  34w 3d         14  %    FL/AC:      19.5   %    20 - 24

 Est. FW:    9530  gm    6 lb 12 oz      81  %
OB History

 Gravidity:    1
Gestational Age

 LMP:           35w 5d        Date:  11/01/19                 EDD:   08/07/20
 U/S Today:     36w 3d                                        EDD:   08/02/20
 Best:          35w 5d     Det. By:  LMP  (11/01/19)          EDD:   08/07/20
Anatomy

 Cranium:               Appears normal         Aortic Arch:            Appears normal
 Cavum:                 Appears normal         Ductal Arch:            Appears normal
 Ventricles:            Appears normal         Diaphragm:              Appears normal
 Choroid Plexus:        Appears normal         Stomach:                Appears normal, left
                                                                       sided
 Cerebellum:            Appears normal         Abdomen:                Appears normal
 Posterior Fossa:       Not well visualized    Abdominal Wall:         Not well visualized
 Nuchal Fold:           Not well visualized    Cord Vessels:           Appears normal (3
                                                                       vessel cord)
 Face:                  Orbits appear          Kidneys:                Appear normal
                        normal
 Lips:                  Appears normal         Bladder:                Appears normal
 Thoracic:              Appears normal         Spine:                  Limited views
                                                                       appear normal
 Heart:                 Not well visualized    Upper Extremities:      Not well visualized
 RVOT:                  Not well visualized    Lower Extremities:      Visualized
 LVOT:                  Not well visualized

 Other:  Nasal bone and Right Heel visualized. Technically difficult due to
         advanced GA and fetal position. Technically difficult due to maternal
         habitus.
Cervix Uterus Adnexa

 Cervix
 Not visualized (advanced GA >28wks)
Comments

 This patient was seen for an ultrasound exam due to recently
 diagnosed preeclampsia.  Her P/C ratio performed last week
 indicated 366 mg of protein.  The patient reports that she also
 has severe anxiety which may contribute to her elevated
 blood pressures.  The patient's initial blood pressure in our
 office was 145/110.  On repeat it was 157/97.  She denies
 any signs or symptoms of severe preeclampsia.  She had
 PI[facility] labs drawn this morning.  The results of those labs are
 currently pending.
 She was informed that the fetal growth and amniotic fluid
 level appears appropriate for her gestational age.  The views
 of the fetal anatomy were limited today due to her advanced
 gestational age.
 A biophysical profile performed today was [DATE].
 Due to preeclampsia with elevated blood pressures, I will give
 the patient a complete course of antenatal corticosteroids
 starting today.  She will return to our office tomorrow to
 receive the second dose.
 Preeclampsia precautions were reviewed with the patient
 today.  She was advised to monitor her blood pressures at
 home and to go to the hospital should her blood pressures be
 persistently greater than 150s over high 90s.  She already
 has an induction scheduled in 9 days at around 37 weeks.
 Delivery prior to 37 weeks would be indicated should she
 complain of any signs or symptoms of preeclampsia, should
 her PIH labs show any abnormalities, or should her blood
 pressures continue to increase.

## 2021-04-10 ENCOUNTER — Other Ambulatory Visit: Payer: Self-pay | Admitting: Obstetrics and Gynecology

## 2022-03-07 ENCOUNTER — Other Ambulatory Visit: Payer: Self-pay | Admitting: Family Medicine
